# Patient Record
Sex: Female | Born: 1950 | Race: White | Hispanic: No | Marital: Married | Smoking: Former smoker
Health system: Southern US, Community
[De-identification: ages and names within clinical notes are randomized; demographics above are authoritative.]

## PROBLEM LIST (undated history)

## (undated) DIAGNOSIS — K589 Irritable bowel syndrome without diarrhea: Secondary | ICD-10-CM

## (undated) DIAGNOSIS — K635 Polyp of colon: Secondary | ICD-10-CM

## (undated) HISTORY — PX: COLONOSCOPY: SHX174

## (undated) HISTORY — DX: Polyp of colon: K63.5

## (undated) HISTORY — DX: Irritable bowel syndrome, unspecified: K58.9

---

## 2002-05-21 ENCOUNTER — Emergency Department (HOSPITAL_COMMUNITY): Admission: EM | Admit: 2002-05-21 | Discharge: 2002-05-21 | Payer: Self-pay | Admitting: Emergency Medicine

## 2004-01-18 ENCOUNTER — Other Ambulatory Visit: Admission: RE | Admit: 2004-01-18 | Discharge: 2004-01-18 | Payer: Self-pay | Admitting: Family Medicine

## 2004-01-18 ENCOUNTER — Ambulatory Visit: Payer: Self-pay | Admitting: Family Medicine

## 2004-02-24 ENCOUNTER — Encounter: Admission: RE | Admit: 2004-02-24 | Discharge: 2004-02-24 | Payer: Self-pay | Admitting: Family Medicine

## 2004-03-01 ENCOUNTER — Ambulatory Visit: Payer: Self-pay | Admitting: Family Medicine

## 2004-03-06 ENCOUNTER — Encounter: Admission: RE | Admit: 2004-03-06 | Discharge: 2004-03-06 | Payer: Self-pay | Admitting: Family Medicine

## 2005-11-16 ENCOUNTER — Other Ambulatory Visit: Admission: RE | Admit: 2005-11-16 | Discharge: 2005-11-16 | Payer: Self-pay | Admitting: Family Medicine

## 2005-11-16 ENCOUNTER — Encounter: Payer: Self-pay | Admitting: Family Medicine

## 2005-11-16 ENCOUNTER — Ambulatory Visit: Payer: Self-pay | Admitting: Family Medicine

## 2005-11-27 ENCOUNTER — Ambulatory Visit: Payer: Self-pay | Admitting: Internal Medicine

## 2005-12-07 ENCOUNTER — Ambulatory Visit: Payer: Self-pay | Admitting: Internal Medicine

## 2005-12-07 ENCOUNTER — Encounter (INDEPENDENT_AMBULATORY_CARE_PROVIDER_SITE_OTHER): Payer: Self-pay | Admitting: Specialist

## 2005-12-07 LAB — HM COLONOSCOPY

## 2006-04-01 ENCOUNTER — Ambulatory Visit: Payer: Self-pay | Admitting: Family Medicine

## 2007-12-18 ENCOUNTER — Encounter: Payer: Self-pay | Admitting: Family Medicine

## 2008-04-09 ENCOUNTER — Other Ambulatory Visit: Admission: RE | Admit: 2008-04-09 | Discharge: 2008-04-09 | Payer: Self-pay | Admitting: Family Medicine

## 2008-04-09 ENCOUNTER — Ambulatory Visit: Payer: Self-pay | Admitting: Family Medicine

## 2008-04-09 ENCOUNTER — Encounter: Payer: Self-pay | Admitting: Family Medicine

## 2008-04-09 DIAGNOSIS — Z86010 Personal history of colonic polyps: Secondary | ICD-10-CM

## 2008-04-09 DIAGNOSIS — Z780 Asymptomatic menopausal state: Secondary | ICD-10-CM | POA: Insufficient documentation

## 2008-04-09 DIAGNOSIS — Z87891 Personal history of nicotine dependence: Secondary | ICD-10-CM

## 2008-04-12 LAB — CONVERTED CEMR LAB
Basophils Absolute: 0.1 10*3/uL (ref 0.0–0.1)
Bilirubin, Direct: 0.1 mg/dL (ref 0.0–0.3)
Calcium: 9.3 mg/dL (ref 8.4–10.5)
Cholesterol: 187 mg/dL (ref 0–200)
GFR calc Af Amer: 83 mL/min
GFR calc non Af Amer: 69 mL/min
HCT: 36.7 % (ref 36.0–46.0)
Hemoglobin: 12.5 g/dL (ref 12.0–15.0)
LDL Cholesterol: 109 mg/dL — ABNORMAL HIGH (ref 0–99)
Lymphocytes Relative: 21.2 % (ref 12.0–46.0)
MCHC: 34 g/dL (ref 30.0–36.0)
Monocytes Absolute: 0.5 10*3/uL (ref 0.1–1.0)
Neutro Abs: 3.4 10*3/uL (ref 1.4–7.7)
RDW: 12.5 % (ref 11.5–14.6)
Sodium: 144 meq/L (ref 135–145)
Total Bilirubin: 0.9 mg/dL (ref 0.3–1.2)
Triglycerides: 86 mg/dL (ref 0–149)

## 2008-04-14 ENCOUNTER — Encounter (INDEPENDENT_AMBULATORY_CARE_PROVIDER_SITE_OTHER): Payer: Self-pay | Admitting: *Deleted

## 2008-11-05 ENCOUNTER — Encounter: Payer: Self-pay | Admitting: Family Medicine

## 2008-11-11 ENCOUNTER — Encounter (INDEPENDENT_AMBULATORY_CARE_PROVIDER_SITE_OTHER): Payer: Self-pay | Admitting: *Deleted

## 2009-07-02 ENCOUNTER — Emergency Department (HOSPITAL_COMMUNITY): Admission: EM | Admit: 2009-07-02 | Discharge: 2009-07-02 | Payer: Self-pay | Admitting: Emergency Medicine

## 2009-11-11 ENCOUNTER — Encounter: Payer: Self-pay | Admitting: Family Medicine

## 2009-11-11 LAB — HM MAMMOGRAPHY: HM Mammogram: NORMAL

## 2009-11-23 ENCOUNTER — Encounter (INDEPENDENT_AMBULATORY_CARE_PROVIDER_SITE_OTHER): Payer: Self-pay | Admitting: *Deleted

## 2009-11-23 ENCOUNTER — Encounter: Payer: Self-pay | Admitting: Family Medicine

## 2010-02-19 ENCOUNTER — Encounter: Payer: Self-pay | Admitting: Family Medicine

## 2010-02-28 NOTE — Miscellaneous (Signed)
  Clinical Lists Changes  Observations: Added new observation of MAMMO DUE: 11/12/2010 (11/11/2009 8:52) Added new observation of MAMMOGRAM: normal (11/11/2009 8:52)

## 2010-02-28 NOTE — Letter (Signed)
Summary: Results Follow up Letter  [facility]  [address] [place], [place] [address]   Phone: [phone]  Fax: [phone]    11/23/2009 MRN: [id number]    GENIA PERIN [address] [place], [place]  [address]    Dear Ms. Sipe,  The following are the results of your recent test(s):  Test         Result    Pap Smear:        Normal _____  Not Normal _____ Comments: ______________________________________________________ Cholesterol: LDL(Bad cholesterol):         Your goal is less than:         HDL (Good cholesterol):       Your goal is more than: Comments:  ______________________________________________________ Mammogram:        Normal __X___  Not Normal _____ Comments:  Yearly follow up is recommended.   ___________________________________________________________________ Hemoccult:        Normal _____  Not normal _______ Comments:    _____________________________________________________________________ Other Tests:    We routinely do not discuss normal results over the telephone.  If you desire a copy of the results, or you have any questions about this information we can discuss them at your next office visit.   Sincerely,    Marne A. Milinda Antis, M.D.  MAT:lsf

## 2010-03-09 ENCOUNTER — Encounter: Payer: Self-pay | Admitting: Family Medicine

## 2010-03-09 ENCOUNTER — Other Ambulatory Visit: Payer: Self-pay | Admitting: Family Medicine

## 2010-03-09 ENCOUNTER — Encounter (INDEPENDENT_AMBULATORY_CARE_PROVIDER_SITE_OTHER): Payer: BC Managed Care – PPO | Admitting: Family Medicine

## 2010-03-09 DIAGNOSIS — Z0000 Encounter for general adult medical examination without abnormal findings: Secondary | ICD-10-CM

## 2010-03-09 LAB — CBC WITH DIFFERENTIAL/PLATELET
Basophils Absolute: 0 K/uL (ref 0.0–0.1)
Basophils Relative: 0.6 % (ref 0.0–3.0)
Eosinophils Absolute: 0.2 K/uL (ref 0.0–0.7)
Eosinophils Relative: 3.8 % (ref 0.0–5.0)
HCT: 37.1 % (ref 36.0–46.0)
Hemoglobin: 12.6 g/dL (ref 12.0–15.0)
Lymphocytes Relative: 21.7 % (ref 12.0–46.0)
Lymphs Abs: 1.1 K/uL (ref 0.7–4.0)
MCHC: 34 g/dL (ref 30.0–36.0)
MCV: 86.7 fl (ref 78.0–100.0)
Monocytes Absolute: 0.4 K/uL (ref 0.1–1.0)
Monocytes Relative: 8.6 % (ref 3.0–12.0)
Neutro Abs: 3.2 K/uL (ref 1.4–7.7)
Neutrophils Relative %: 65.3 % (ref 43.0–77.0)
Platelets: 178 K/uL (ref 150.0–400.0)
RBC: 4.28 Mil/uL (ref 3.87–5.11)
RDW: 13.6 % (ref 11.5–14.6)
WBC: 4.9 K/uL (ref 4.5–10.5)

## 2010-03-09 LAB — BASIC METABOLIC PANEL
BUN: 15 mg/dL (ref 6–23)
Creatinine, Ser: 0.9 mg/dL (ref 0.4–1.2)
GFR: 68.31 mL/min (ref >60.00)

## 2010-03-09 LAB — LIPID PANEL
Cholesterol: 199 mg/dL (ref 0–200)
LDL Cholesterol: 115 mg/dL — ABNORMAL HIGH (ref 0–99)
Triglycerides: 89 mg/dL (ref 0.0–149.0)
VLDL: 17.8 mg/dL (ref 0.0–40.0)

## 2010-03-09 LAB — HEPATIC FUNCTION PANEL
ALT: 24 U/L (ref 0–35)
Bilirubin, Direct: 0.1 mg/dL (ref 0.0–0.3)
Total Bilirubin: 0.3 mg/dL (ref 0.3–1.2)

## 2010-03-16 NOTE — Assessment & Plan Note (Signed)
Summary: CPX/ CLE   Vital Signs:  Patient profile:   60 year old female Height:      64.25 inches Weight:      190.75 pounds BMI:     32.61 Temp:     98.1 degrees F oral Pulse rate:   64 / minute Pulse rhythm:   regular BP sitting:   110 / 78  (right arm) Cuff size:   regular  Vitals Entered By: Lewanda Rife LPN (March 09, 2010 10:03 AM) CC: CPX LMP 2 yrs ago   History of Present Illness: here for health mt exam and to disc chronic medical problems is feeling good  nothing new medically    wt is up 8 lb so tough to loose weight  does exercise -- walks at lunch hour at work or goes to work -- 35 minutes    110/78-- good bp  colon polyp 11/07- due in fall 2012  nl pap 3/10 - no symptoms/ no problems/ no new partners  no abn paps -- all normal    mam 10/11--normal  self exam   td 07 got flu shot in oct    Allergies (verified): No Known Drug Allergies  Past History:  Past Medical History: Last updated: 04/09/2008 colon polyps former smoker  poss IBS   Past Surgical History: Last updated: 04/09/2008 2001- colonoscopy- 1 polyp 11/07- colonioscopy, polpy, tic, hem  Family History: Last updated: 04/09/2008 Family History of Colon CA 1st degree relative (mother, possibly father) Family History of Stomach CA(father) Family History Ovarian cancer (aunt) Family History Lung cancer (uncle)  Social History: Last updated: 04/09/2008 Occupation: works in Materials engineer Former Smoker (quit 2003) Alcohol use-no Drug use-no Regular exercise-yes married   Risk Factors: Exercise: yes (04/09/2008)  Risk Factors: Smoking Status: quit (04/09/2008)  Review of Systems General:  Denies fatigue, loss of appetite, and malaise. Eyes:  Denies blurring and eye irritation. CV:  Denies chest pain or discomfort, palpitations, shortness of breath with exertion, and swelling of feet. Resp:  Denies cough, shortness of breath, and wheezing. GI:  Denies abdominal  pain, change in bowel habits, indigestion, and nausea. GU:  Denies abnormal vaginal bleeding, discharge, dysuria, and urinary frequency. MS:  Denies joint pain, joint redness, joint swelling, and muscle weakness. Derm:  Denies itching, lesion(s), poor wound healing, and rash. Neuro:  Denies numbness and tingling. Psych:  Denies anxiety and depression. Endo:  Denies cold intolerance, excessive thirst, excessive urination, and heat intolerance. Heme:  Denies abnormal bruising and bleeding.  Physical Exam  General:  overweight but generally well appearing  Head:  normocephalic, atraumatic, and no abnormalities observed.   Eyes:  vision grossly intact, pupils equal, pupils round, and pupils reactive to light.  no conjunctival pallor, injection or icterus  Ears:  R ear normal and L ear normal.   Nose:  no nasal discharge.   Mouth:  pharynx pink and moist.   Neck:  supple with full rom and no masses or thyromegally, no JVD or carotid bruit  Chest Wall:  No deformities, masses, or tenderness noted. Breasts:  No mass, nodules, thickening, tenderness, bulging, retraction, inflamation, nipple discharge or skin changes noted.   Lungs:  Normal respiratory effort, chest expands symmetrically. Lungs are clear to auscultation, no crackles or wheezes. Heart:  Normal rate and regular rhythm. S1 and S2 normal without gallop, murmur, click, rub or other extra sounds. Abdomen:  Bowel sounds positive,abdomen soft and non-tender without masses, organomegaly or hernias noted. no renal bruits  Msk:  No deformity or scoliosis noted of thoracic or lumbar spine.  no acute joint changes  Pulses:  R and L carotid,radial,femoral,dorsalis pedis and posterior tibial pulses are full and equal bilaterally Extremities:  No clubbing, cyanosis, edema, or deformity noted with normal full range of motion of all joints.   Neurologic:  sensation intact to light touch, gait normal, and DTRs symmetrical and normal.   Skin:  Intact  without suspicious lesions or rashes few lentigos  Cervical Nodes:  No lymphadenopathy noted Axillary Nodes:  No palpable lymphadenopathy Inguinal Nodes:  No significant adenopathy Psych:  normal affect, talkative and pleasant    Impression & Recommendations:  Problem # 1:  HEALTH MAINTENANCE EXAM (ICD-V70.0) Assessment Comment Only reviewed health habits including diet, exercise and skin cancer prevention reviewed health maintenance list and family history  wellness labs today Orders: Venipuncture (13086) TLB-Lipid Panel (80061-LIPID) TLB-BMP (Basic Metabolic Panel-BMET) (80048-METABOL) TLB-CBC Platelet - w/Differential (85025-CBCD) TLB-Hepatic/Liver Function Pnl (80076-HEPATIC) TLB-TSH (Thyroid Stimulating Hormone) (84443-TSH)  Complete Medication List: 1)  Multivitamins Tabs (Multiple vitamin) .... Take 1 tablet by mouth once a day  Patient Instructions: 1)  your colonoscopy is due in november 2)  call us in august to do referral for that  3)  keep working on healthy diet and exercise- I find weight watchers program helpful either on line or meetings  4)  labs today   Orders Added: 1)  Venipuncture [36415] 2)  TLB-Lipid Panel [80061-LIPID] 3)  TLB-BMP (Basic Metabolic Panel-BMET) [80048-METABOL] 4)  TLB-CBC Platelet - w/Differential [85025-CBCD] 5)  TLB-Hepatic/Liver Function Pnl [80076-HEPATIC] 6)  TLB-TSH (Thyroid Stimulating Hormone) [84443-TSH] 7)  Est. Patient 40-64 years [57846]   Immunization History:  Influenza Immunization History:    Influenza:  fluvax 3+ (11/02/2009)   Immunization History:  Influenza Immunization History:    Influenza:  Fluvax 3+ (11/02/2009)  Current Allergies (reviewed today): No known allergies

## 2010-12-26 ENCOUNTER — Encounter: Payer: Self-pay | Admitting: Family Medicine

## 2010-12-27 ENCOUNTER — Ambulatory Visit (INDEPENDENT_AMBULATORY_CARE_PROVIDER_SITE_OTHER): Payer: BC Managed Care – PPO | Admitting: Family Medicine

## 2010-12-27 ENCOUNTER — Encounter: Payer: Self-pay | Admitting: Family Medicine

## 2010-12-27 VITALS — BP 124/76 | HR 76 | Temp 97.9°F | Ht 64.25 in | Wt 197.0 lb

## 2010-12-27 DIAGNOSIS — D126 Benign neoplasm of colon, unspecified: Secondary | ICD-10-CM

## 2010-12-27 DIAGNOSIS — Z0000 Encounter for general adult medical examination without abnormal findings: Secondary | ICD-10-CM | POA: Insufficient documentation

## 2010-12-27 DIAGNOSIS — K219 Gastro-esophageal reflux disease without esophagitis: Secondary | ICD-10-CM

## 2010-12-27 LAB — TSH: TSH: 0.76 u[IU]/mL (ref 0.35–5.50)

## 2010-12-27 LAB — CBC WITH DIFFERENTIAL/PLATELET
Basophils Relative: 1.1 % (ref 0.0–3.0)
Eosinophils Relative: 5.3 % — ABNORMAL HIGH (ref 0.0–5.0)
Lymphocytes Relative: 22.4 % (ref 12.0–46.0)
MCV: 86 fl (ref 78.0–100.0)
Monocytes Relative: 8.6 % (ref 3.0–12.0)
Neutrophils Relative %: 62.6 % (ref 43.0–77.0)
RBC: 4.48 Mil/uL (ref 3.87–5.11)
WBC: 5.1 10*3/uL (ref 4.5–10.5)

## 2010-12-27 LAB — COMPREHENSIVE METABOLIC PANEL
Albumin: 4.3 g/dL (ref 3.5–5.2)
BUN: 19 mg/dL (ref 6–23)
CO2: 27 mEq/L (ref 19–32)
Calcium: 9.1 mg/dL (ref 8.4–10.5)
Chloride: 105 mEq/L (ref 96–112)
Glucose, Bld: 93 mg/dL (ref 70–99)
Potassium: 4 mEq/L (ref 3.5–5.1)
Sodium: 141 mEq/L (ref 135–145)
Total Protein: 8 g/dL (ref 6.0–8.3)

## 2010-12-27 LAB — LIPID PANEL: Cholesterol: 186 mg/dL (ref 0–200)

## 2010-12-27 MED ORDER — RANITIDINE HCL 150 MG PO TABS: 150.0000 mg | ORAL_TABLET | Freq: Two times a day (BID) | ORAL | Status: DC

## 2010-12-27 NOTE — Patient Instructions (Addendum)
Try to get 1200-1500 mg of calcium per day with at least 1000 iu of vitamin D - for bone health  In addition to multivitamin  We will do colonoscopy referral at check out  Labs today  Start zantac 150 mg twice daily for acid reflux  Follow up in about 6 months  If zantac does not work -- please call  Keep up the good work with healthy diet and exercise

## 2010-12-27 NOTE — Progress Notes (Signed)
Subjective:    Patient ID: [name] [name], female    DOB: [birth year], 60 y.o.   MRN: [id number]  HPI Has been doing well  Nothing new going on health wise except heartburn  Taken tums and rolaids - and they help  2-3 times per week   Needs health mt exam  bp is 124/76 Wt is stable with bmi 33  Walks for exercise -- walks 5 days per week 30 min - moderate pace  Tries to watch diet -- is good most of the time   Has not tried wt watchers   Labs good  Lab Results  Component Value Date   CHOL 199 03/09/2010   HDL 65.90 03/09/2010   LDLCALC 115* 03/09/2010   TRIG 89.0 03/09/2010   CHOLHDL 3 03/09/2010    Needs labs today - hopes cholesterol will be even better  No fam hx  No heart dz   Got flu shot -mid oct at work  mammo 1 mo ago - thru work - was good  No lumps on self exam   Tdap 10/07  11/07 colonoscopy - and had adenomatous polyps  Is time to schedule  No stool changes   Still a non smoker   3/10 nl pap  No abn pap No new partners No gyn symptoms  No periods  Does not need pap today  Patient Active Problem List  Diagnoses  . COLONIC POLYPS  . TOBACCO USE, QUIT  . POSTMENOPAUSAL STATUS  . Routine general medical examination at a health care facility  . GERD (gastroesophageal reflux disease)   Past Medical History  Diagnosis Date  . Colon polyps   . IBS (irritable bowel syndrome)     possible   No past surgical history on file. History  Substance Use Topics  . Smoking status: Former Smoker    Quit date: 01/29/2001  . Smokeless tobacco: Not on file  . Alcohol Use: No   Family History  Problem Relation Age of Onset  . Cancer Mother     colon cancer  . Cancer Father     stomach CA   No Known Allergies Current Outpatient Prescriptions on File Prior to Visit  Medication Sig Dispense Refill  . Multiple Vitamin (MULTIVITAMIN) tablet Take 1 tablet by mouth daily.              Review of Systems Review of Systems  Constitutional: Negative for  fever, appetite change, fatigue and unexpected weight change.  Eyes: Negative for pain and visual disturbance.  Respiratory: Negative for cough and shortness of breath.   Cardiovascular: Negative for cp or palpitations    Gastrointestinal: Negative for nausea, diarrhea and constipation. pos for heartburn/ indigestion Genitourinary: Negative for urgency and frequency.  Skin: Negative for pallor or rash   Neurological: Negative for weakness, light-headedness, numbness and headaches.  Hematological: Negative for adenopathy. Does not bruise/bleed easily.  Psychiatric/Behavioral: Negative for dysphoric mood. The patient is not nervous/anxious.          Objective:   Physical Exam  Constitutional: She appears well-developed and well-nourished. No distress.       overwt and well appearing   HENT:  Head: Normocephalic and atraumatic.  Right Ear: External ear normal.  Left Ear: External ear normal.  Nose: Nose normal.  Mouth/Throat: Oropharynx is clear and moist.  Eyes: Conjunctivae and EOM are normal. Pupils are equal, round, and reactive to light. No scleral icterus.  Neck: Normal range of motion. Neck supple. Carotid bruit is not present.  No tracheal deviation present. No thyromegaly present.  Cardiovascular: Normal rate, regular rhythm, normal heart sounds and intact distal pulses.  Exam reveals no gallop.   Pulmonary/Chest: Effort normal and breath sounds normal. No respiratory distress. She has no wheezes. She exhibits no tenderness.  Abdominal: Soft. Bowel sounds are normal. She exhibits no distension, no abdominal bruit and no mass. There is no tenderness.  Genitourinary: No breast swelling, tenderness, discharge or bleeding.       No breast masses noted  Musculoskeletal: Normal range of motion. She exhibits no edema and no tenderness.  Lymphadenopathy:    She has no cervical adenopathy.  Neurological: She is alert. She has normal reflexes. No cranial nerve deficit. She exhibits  normal muscle tone. Coordination normal.  Skin: Skin is warm and dry. No rash noted. No erythema. No pallor.  Psychiatric: She has a normal mood and affect.          Assessment & Plan:

## 2010-12-28 NOTE — Assessment & Plan Note (Signed)
New heartburn symptoms- disc risk of progression to esoph injury tx with zantac 150 bid with close f/u Also rev lifestyle change incl diet and wt loss  F/u planned

## 2010-12-28 NOTE — Assessment & Plan Note (Signed)
Reviewed health habits including diet and exercise and skin cancer prevention Also reviewed health mt list, fam hx and immunizations  Wellness labs today Disc need for wt loss for better health

## 2010-12-28 NOTE — Assessment & Plan Note (Signed)
Due for surveillance colonoscopy- that will be scheduled

## 2011-01-31 ENCOUNTER — Encounter: Payer: Self-pay | Admitting: Internal Medicine

## 2011-02-06 ENCOUNTER — Encounter: Payer: Self-pay | Admitting: Internal Medicine

## 2011-04-02 ENCOUNTER — Ambulatory Visit (AMBULATORY_SURGERY_CENTER): Payer: BC Managed Care – PPO | Admitting: *Deleted

## 2011-04-02 VITALS — Ht 65.0 in | Wt 194.0 lb

## 2011-04-02 DIAGNOSIS — Z1211 Encounter for screening for malignant neoplasm of colon: Secondary | ICD-10-CM

## 2011-04-02 DIAGNOSIS — Z86010 Personal history of colonic polyps: Secondary | ICD-10-CM

## 2011-04-02 MED ORDER — PEG-KCL-NACL-NASULF-NA ASC-C 100 G PO SOLR: ORAL | Status: DC

## 2011-04-16 ENCOUNTER — Encounter: Payer: Self-pay | Admitting: Internal Medicine

## 2011-04-16 ENCOUNTER — Ambulatory Visit (AMBULATORY_SURGERY_CENTER): Payer: BC Managed Care – PPO | Admitting: Internal Medicine

## 2011-04-16 VITALS — BP 139/76 | HR 60 | Temp 97.3°F | Resp 18 | Ht 64.0 in | Wt 197.0 lb

## 2011-04-16 DIAGNOSIS — Z86010 Personal history of colonic polyps: Secondary | ICD-10-CM

## 2011-04-16 DIAGNOSIS — Z800 Family history of malignant neoplasm of digestive organs: Secondary | ICD-10-CM

## 2011-04-16 DIAGNOSIS — K635 Polyp of colon: Secondary | ICD-10-CM

## 2011-04-16 DIAGNOSIS — Z1211 Encounter for screening for malignant neoplasm of colon: Secondary | ICD-10-CM

## 2011-04-16 DIAGNOSIS — D126 Benign neoplasm of colon, unspecified: Secondary | ICD-10-CM

## 2011-04-16 MED ORDER — SODIUM CHLORIDE 0.9 % IV SOLN: 500.0000 mL | INTRAVENOUS | Status: DC

## 2011-04-16 NOTE — Progress Notes (Signed)
Patient did not have preoperative order for IV antibiotic SSI prophylaxis. (G8918)  Patient did not experience any of the following events: a burn prior to discharge; a fall within the facility; wrong site/side/patient/procedure/implant event; or a hospital transfer or hospital admission upon discharge from the facility. (G8907)  

## 2011-04-16 NOTE — Patient Instructions (Addendum)

## 2011-04-16 NOTE — Op Note (Signed)
[name] [name] [address] [place], [place]  [address]  COLONOSCOPY PROCEDURE REPORT  PATIENT:  [name], [name]  MR#:  [id number] BIRTHDATE:  1950/04/15, 60 yrs. old  GENDER:  female ENDOSCOPIST:  Iva Boop, MD, [facility]  PROCEDURE DATE:  04/16/2011 PROCEDURE:  Colonoscopy with biopsy and snare polypectomy ASA CLASS:  Class I INDICATIONS:  surveillance and high-risk screening, history of pre-cancerous (adenomatous) colon polyps, family history of colon cancer POLYPS REMOVED 2000 (PATH UNKNOWN) AND 2 ADENOMAS REMOVED 2007 - 8 MM MAX) FATHER (60'S) AND MOTHER (80'S) DIED FROM COLON CANCER MEDICATIONS:   These medications were titrated to patient response per physician's verbal order, Fentanyl 75 mcg IV, Versed 7 mg IV  DESCRIPTION OF PROCEDURE:   After the risks benefits and alternatives of the procedure were thoroughly explained, informed consent was obtained.  Digital rectal exam was performed and revealed no abnormalities.   The [id number] endoscope was introduced through the anus and advanced to the cecum, which was identified by both the appendix and ileocecal valve, without limitations.  The quality of the prep was good, using MoviPrep. The instrument was then slowly withdrawn as the colon was fully examined. <<PROCEDUREIMAGES>>  FINDINGS:  There were multiple polyps identified and removed. Six polyps removed. Four ascending largest 8 mm and another 6 mm and 4 mm cold snared and 2 mm removed with cold biopsy. Transverse 6-7 mm cold snared and a 2-3 mm sigmoid polyp cold biopsy removal. All sent to pathology.  This was otherwise a normal examination of the colon. Includes right colon retroflexion.   Retroflexed views in the rectum revealed no abnormalities.    The time to cecum = 3:29 minutes. The scope was then withdrawn in 20:31 minutes from the cecum and the procedure completed. COMPLICATIONS:  None ENDOSCOPIC IMPRESSION: 1) Six polyps removed, largest  8 mm 2) Otherwise normal examination, good prep 3) Personal history of adenomas and family history of CRCA in both parents  REPEAT EXAM:  In for Colonoscopy, pending biopsy results.  Iva Boop, MD, [facility]  CC:  The Patient  n. eSIGNED:   Iva Boop at 04/16/2011 09:21 AM  Christiana Fuchs, [phone]

## 2011-04-17 ENCOUNTER — Telehealth: Payer: Self-pay

## 2011-04-17 NOTE — Telephone Encounter (Signed)
  Follow up Call-  Call back number 04/16/2011  Post procedure Call Back phone  # [phone] ext [phone]  Permission to leave phone message Yes     Patient questions:  Do you have a fever, pain , or abdominal swelling? no Pain Score  0 *  Have you tolerated food without any problems? yes  Have you been able to return to your normal activities? yes  Do you have any questions about your discharge instructions: Diet   no Medications  no Follow up visit  no  Do you have questions or concerns about your Care? no  Actions: * If pain score is 4 or above: No action needed, pain <4.

## 2011-04-19 ENCOUNTER — Encounter: Payer: Self-pay | Admitting: Internal Medicine

## 2011-04-19 NOTE — Progress Notes (Signed)
Quick Note:  4 TA's and a serrated adenoma. Largest polyp 8 mm Repeat colonoscopy 3 years around 03/2014 ______

## 2011-12-05 ENCOUNTER — Ambulatory Visit (HOSPITAL_BASED_OUTPATIENT_CLINIC_OR_DEPARTMENT_OTHER)
Admission: RE | Admit: 2011-12-05 | Discharge: 2011-12-05 | Disposition: A | Discharge status: Home / Self Care | Payer: BC Managed Care – PPO | Source: Ambulatory Visit | Attending: Family | Admitting: Family

## 2011-12-05 ENCOUNTER — Ambulatory Visit (INDEPENDENT_AMBULATORY_CARE_PROVIDER_SITE_OTHER): Payer: BC Managed Care – PPO | Admitting: Family

## 2011-12-05 ENCOUNTER — Telehealth: Payer: Self-pay | Admitting: Family

## 2011-12-05 ENCOUNTER — Encounter: Payer: Self-pay | Admitting: Family

## 2011-12-05 VITALS — BP 102/76 | HR 76 | Temp 98.6°F | Resp 16 | Wt 173.0 lb

## 2011-12-05 DIAGNOSIS — M79609 Pain in unspecified limb: Secondary | ICD-10-CM

## 2011-12-05 DIAGNOSIS — M79673 Pain in unspecified foot: Secondary | ICD-10-CM

## 2011-12-05 DIAGNOSIS — IMO0002 Reserved for concepts with insufficient information to code with codable children: Secondary | ICD-10-CM

## 2011-12-05 DIAGNOSIS — S93409A Sprain of unspecified ligament of unspecified ankle, initial encounter: Secondary | ICD-10-CM

## 2011-12-05 NOTE — Telephone Encounter (Signed)
Reviewed x-ray.  Neg for fracture.  Called pt. Recommended ACE wrap, ice, ibuprofen prn.  Pt verbalizes understanding.

## 2011-12-05 NOTE — Patient Instructions (Addendum)
Please complete your x ray on the first floor.  We will contact you with your results.   

## 2011-12-05 NOTE — Progress Notes (Signed)
  Subjective:    Patient ID: [name] [name], female    DOB: [birth year], 61 y.o.   MRN: [id number]  HPI  [name] [name] is a 61 yr old female who presents today with chief complaint of left foot pain. She reports pain started following an injury on 11/1. [place] [facility] and twisted her left ankle. Since that time she has found it very difficult to put pressure on her left foot. She reports associated swelling. Has tried aspirin without significant relief.  Review of Systems    see HPI  Past Medical History  Diagnosis Date  . Colon polyps   . IBS (irritable bowel syndrome)     possible    History   Social History  . Marital Status: Married    Spouse Name: N/A    Number of Children: N/A  . Years of Education: N/A   Occupational History  . Not on file.   Social History Main Topics  . Smoking status: Former Smoker    Quit date: 01/29/2001  . Smokeless tobacco: Never Used  . Alcohol Use: No  . Drug Use: No  . Sexually Active:    Other Topics Concern  . Not on file   Social History Narrative  . No narrative on file    No past surgical history on file.  Family History  Problem Relation Age of Onset  . Colon cancer Mother 60  . Colon cancer Father 30    Allergies  Allergen Reactions  . Fish Allergy Hives    Current Outpatient Prescriptions on File Prior to Visit  Medication Sig Dispense Refill  . Multiple Vitamin (MULTIVITAMIN) tablet Take 1 tablet by mouth daily.        . ranitidine (ZANTAC) 150 MG tablet Take 1 tablet (150 mg total) by mouth 2 (two) times daily.  60 tablet  11    BP 102/76  Pulse 76  Temp 98.6 F (37 C) (Oral)  Resp 16  Wt 173 lb (78.472 kg)  SpO2 99%    Objective:   Physical Exam  Constitutional: She appears well-developed and well-nourished. No distress.  Cardiovascular: Normal rate and regular rhythm.   No murmur heard. Pulmonary/Chest: Effort normal and breath sounds normal.    Musculoskeletal:       + swelling left dorsal foot.  + tenderness to palpation of left first metatarsal.           Assessment & Plan:

## 2011-12-07 ENCOUNTER — Telehealth: Payer: Self-pay | Admitting: *Deleted

## 2011-12-07 DIAGNOSIS — IMO0002 Reserved for concepts with insufficient information to code with codable children: Secondary | ICD-10-CM | POA: Insufficient documentation

## 2011-12-07 NOTE — Telephone Encounter (Signed)
Pt returned my and was notified of completion and verified receipt of fax.

## 2011-12-07 NOTE — Assessment & Plan Note (Signed)
X ray is reviewed and is negative for fracture.  Recommended ACE, motrin prn, elevate as much as possible.

## 2011-12-07 NOTE — Telephone Encounter (Signed)
Received fax from pt requesting completion of an [facility] injury form for DOS 12/05/11. Form received on 12/05/11 and faxed back to [phone] at pt's request. Form faxed at 1:27pm today. Left message on home # to return my call.

## 2012-02-26 ENCOUNTER — Encounter: Payer: BC Managed Care – PPO | Admitting: Family Medicine

## 2012-02-27 ENCOUNTER — Encounter: Payer: BC Managed Care – PPO | Admitting: Family Medicine

## 2012-03-11 ENCOUNTER — Other Ambulatory Visit (HOSPITAL_COMMUNITY)
Admission: RE | Admit: 2012-03-11 | Discharge: 2012-03-11 | Disposition: A | Discharge status: Home / Self Care | Payer: BC Managed Care – PPO | Source: Ambulatory Visit | Attending: Family Medicine | Admitting: Family Medicine

## 2012-03-11 ENCOUNTER — Ambulatory Visit (INDEPENDENT_AMBULATORY_CARE_PROVIDER_SITE_OTHER): Payer: 59 | Admitting: Family Medicine

## 2012-03-11 ENCOUNTER — Encounter: Payer: Self-pay | Admitting: Family Medicine

## 2012-03-11 VITALS — BP 118/88 | HR 67 | Temp 98.1°F | Ht 64.5 in | Wt 173.8 lb

## 2012-03-11 DIAGNOSIS — Z23 Encounter for immunization: Secondary | ICD-10-CM

## 2012-03-11 DIAGNOSIS — Z2911 Encounter for prophylactic immunotherapy for respiratory syncytial virus (RSV): Secondary | ICD-10-CM

## 2012-03-11 DIAGNOSIS — Z01419 Encounter for gynecological examination (general) (routine) without abnormal findings: Secondary | ICD-10-CM | POA: Insufficient documentation

## 2012-03-11 DIAGNOSIS — Z0000 Encounter for general adult medical examination without abnormal findings: Secondary | ICD-10-CM

## 2012-03-11 DIAGNOSIS — Z1151 Encounter for screening for human papillomavirus (HPV): Secondary | ICD-10-CM | POA: Insufficient documentation

## 2012-03-11 LAB — CBC WITH DIFFERENTIAL/PLATELET
Basophils Absolute: 0 10*3/uL (ref 0.0–0.1)
Eosinophils Relative: 3.6 % (ref 0.0–5.0)
HCT: 38.2 % (ref 36.0–46.0)
Lymphocytes Relative: 21.8 % (ref 12.0–46.0)
Monocytes Relative: 8.9 % (ref 3.0–12.0)
Neutrophils Relative %: 64.9 % (ref 43.0–77.0)
Platelets: 195 10*3/uL (ref 150.0–400.0)
RDW: 14.3 % (ref 11.5–14.6)
WBC: 4.8 10*3/uL (ref 4.5–10.5)

## 2012-03-11 LAB — COMPREHENSIVE METABOLIC PANEL
ALT: 19 U/L (ref 0–35)
Albumin: 4.3 g/dL (ref 3.5–5.2)
CO2: 28 mEq/L (ref 19–32)
Calcium: 9.2 mg/dL (ref 8.4–10.5)
Chloride: 104 mEq/L (ref 96–112)
GFR: 69.62 mL/min (ref >60.00)
Glucose, Bld: 93 mg/dL (ref 70–99)
Potassium: 3.6 mEq/L (ref 3.5–5.1)
Sodium: 138 mEq/L (ref 135–145)
Total Bilirubin: 0.5 mg/dL (ref 0.3–1.2)
Total Protein: 8.1 g/dL (ref 6.0–8.3)

## 2012-03-11 LAB — TSH: TSH: 0.68 u[IU]/mL (ref 0.35–5.50)

## 2012-03-11 LAB — LDL CHOLESTEROL, DIRECT: Direct LDL: 103.3 mg/dL

## 2012-03-11 LAB — LIPID PANEL: HDL: 74.3 mg/dL (ref >39.00)

## 2012-03-11 NOTE — Progress Notes (Signed)
Subjective:    Patient ID: [name] [name], female    DOB: 07-27-1950, 62 y.o.   MRN: [id number]  HPI Here for health maintenance exam and to review chronic medical problems    Is doing well overall  Nothing new going on   Wt is stable with bmi of 29  Pap 3/10 Will do 3 year pap today  Gyn -no issues or problems   mamogram 10/13- through work  Self exam -no lumps or changes   Is exercising - joined the gym    Flu shot- got that in oct   Zoster status-her ins covers in the office   colonosc 3/13- fam hx and personal hx of polyps  Keeping a close eye on her - 3 year recall     Needs labs today   Patient Active Problem List  Diagnosis  . Personal history of colonic polyps - adenomas   . TOBACCO USE, QUIT  . POSTMENOPAUSAL STATUS  . Routine general medical examination at a health care facility  . GERD (gastroesophageal reflux disease)  . Sprain of ankle or foot, left  . Routine gynecological examination   Past Medical History  Diagnosis Date  . Colon polyps   . IBS (irritable bowel syndrome)     possible   No past surgical history on file. History  Substance Use Topics  . Smoking status: Former Smoker    Quit date: 01/29/2001  . Smokeless tobacco: Never Used  . Alcohol Use: No   Family History  Problem Relation Age of Onset  . Colon cancer Mother 68  . Colon cancer Father 46   Allergies  Allergen Reactions  . Fish Allergy Hives   Current Outpatient Prescriptions on File Prior to Visit  Medication Sig Dispense Refill  . Multiple Vitamin (MULTIVITAMIN) tablet Take 1 tablet by mouth daily.         No current facility-administered medications on file prior to visit.       Review of Systems Review of Systems  Constitutional: Negative for fever, appetite change, fatigue and unexpected weight change.  Eyes: Negative for pain and visual disturbance.  Respiratory: Negative for cough and shortness of breath.   Cardiovascular: Negative for cp or  palpitations    Gastrointestinal: Negative for nausea, diarrhea and constipation.  Genitourinary: Negative for urgency and frequency.  Skin: Negative for pallor or rash   Neurological: Negative for weakness, light-headedness, numbness and headaches.  Hematological: Negative for adenopathy. Does not bruise/bleed easily.  Psychiatric/Behavioral: Negative for dysphoric mood. The patient is not nervous/anxious.         Objective:   Physical Exam  Constitutional: She appears well-developed and well-nourished. No distress.  obese and well appearing   HENT:  Head: Normocephalic and atraumatic.  Right Ear: External ear normal.  Left Ear: External ear normal.  Nose: Nose normal.  Mouth/Throat: Oropharynx is clear and moist. No oropharyngeal exudate.  Eyes: Conjunctivae and EOM are normal. Pupils are equal, round, and reactive to light. Right eye exhibits no discharge. Left eye exhibits no discharge.  Neck: Normal range of motion. Neck supple. No JVD present. No thyromegaly present.  Cardiovascular: Normal rate, regular rhythm, normal heart sounds and intact distal pulses.  Exam reveals no gallop.   Pulmonary/Chest: Effort normal and breath sounds normal. No respiratory distress. She has no wheezes.  Abdominal: Soft. Bowel sounds are normal. She exhibits no distension, no abdominal bruit and no mass. There is no tenderness.  Genitourinary: Vagina normal and uterus normal. No breast swelling,  tenderness or discharge. There is no rash, tenderness or lesion on the right labia. There is no rash, tenderness or lesion on the left labia. Uterus is not enlarged and not tender. Cervix exhibits no motion tenderness, no discharge and no friability. Right adnexum displays no mass, no tenderness and no fullness. Left adnexum displays no mass, no tenderness and no fullness. No bleeding around the vagina. No vaginal discharge found.  Breast exam: No mass, nodules, thickening, tenderness, bulging, retraction,  inflamation, nipple discharge or skin changes noted.  No axillary or clavicular LA.  Chaperoned exam.    Musculoskeletal: She exhibits no edema and no tenderness.  Lymphadenopathy:    She has no cervical adenopathy.  Neurological: She is alert. She has normal reflexes. No cranial nerve deficit. She exhibits normal muscle tone. Coordination normal.  Skin: Skin is warm and dry. No rash noted. No erythema. No pallor.  Psychiatric: She has a normal mood and affect.          Assessment & Plan:

## 2012-03-11 NOTE — Assessment & Plan Note (Signed)
Reviewed health habits including diet and exercise and skin cancer prevention Also reviewed health mt list, fam hx and immunizations  Wellness labs today Zoster vaccine today

## 2012-03-11 NOTE — Assessment & Plan Note (Signed)
Annual exam with pap done  No problems

## 2012-03-11 NOTE — Patient Instructions (Addendum)
Shingles vaccine today Take care of yourself with healthy diet and exercise

## 2012-03-12 ENCOUNTER — Encounter: Payer: Self-pay | Admitting: *Deleted

## 2012-03-18 ENCOUNTER — Encounter: Payer: Self-pay | Admitting: *Deleted

## 2012-10-30 ENCOUNTER — Other Ambulatory Visit: Payer: Self-pay | Admitting: Family Medicine

## 2012-10-30 DIAGNOSIS — Z1231 Encounter for screening mammogram for malignant neoplasm of breast: Secondary | ICD-10-CM

## 2012-10-31 ENCOUNTER — Ambulatory Visit (HOSPITAL_BASED_OUTPATIENT_CLINIC_OR_DEPARTMENT_OTHER)
Admission: RE | Admit: 2012-10-31 | Discharge: 2012-10-31 | Disposition: A | Discharge status: Home / Self Care | Payer: 59 | Source: Ambulatory Visit | Attending: Family Medicine | Admitting: Family Medicine

## 2012-10-31 DIAGNOSIS — Z1231 Encounter for screening mammogram for malignant neoplasm of breast: Secondary | ICD-10-CM | POA: Insufficient documentation

## 2012-12-04 ENCOUNTER — Other Ambulatory Visit: Payer: Self-pay

## 2013-03-16 ENCOUNTER — Ambulatory Visit (INDEPENDENT_AMBULATORY_CARE_PROVIDER_SITE_OTHER): Payer: 59 | Admitting: Family Medicine

## 2013-03-16 ENCOUNTER — Encounter: Payer: Self-pay | Admitting: Family Medicine

## 2013-03-16 VITALS — BP 98/64 | HR 64 | Temp 98.3°F | Ht 64.5 in | Wt 179.2 lb

## 2013-03-16 DIAGNOSIS — Z0000 Encounter for general adult medical examination without abnormal findings: Secondary | ICD-10-CM

## 2013-03-16 LAB — LIPID PANEL
CHOL/HDL RATIO: 3
CHOLESTEROL: 187 mg/dL (ref 0–200)
HDL: 68 mg/dL (ref >39.00)
LDL CALC: 105 mg/dL — AB (ref 0–99)
TRIGLYCERIDES: 71 mg/dL (ref 0.0–149.0)
VLDL: 14.2 mg/dL (ref 0.0–40.0)

## 2013-03-16 LAB — CBC WITH DIFFERENTIAL/PLATELET
BASOS ABS: 0 10*3/uL (ref 0.0–0.1)
Basophils Relative: 1 % (ref 0.0–3.0)
EOS ABS: 0.2 10*3/uL (ref 0.0–0.7)
Eosinophils Relative: 4.3 % (ref 0.0–5.0)
HCT: 39.9 % (ref 36.0–46.0)
HEMOGLOBIN: 13 g/dL (ref 12.0–15.0)
LYMPHS PCT: 24.9 % (ref 12.0–46.0)
Lymphs Abs: 1.1 10*3/uL (ref 0.7–4.0)
MCHC: 32.6 g/dL (ref 30.0–36.0)
MCV: 87.2 fl (ref 78.0–100.0)
MONO ABS: 0.5 10*3/uL (ref 0.1–1.0)
Monocytes Relative: 10 % (ref 3.0–12.0)
NEUTROS ABS: 2.7 10*3/uL (ref 1.4–7.7)
NEUTROS PCT: 59.8 % (ref 43.0–77.0)
Platelets: 176 10*3/uL (ref 150.0–400.0)
RBC: 4.57 Mil/uL (ref 3.87–5.11)
RDW: 14.3 % (ref 11.5–14.6)
WBC: 4.6 10*3/uL (ref 4.5–10.5)

## 2013-03-16 LAB — COMPREHENSIVE METABOLIC PANEL
ALK PHOS: 82 U/L (ref 39–117)
ALT: 22 U/L (ref 0–35)
AST: 22 U/L (ref 0–37)
Albumin: 4.2 g/dL (ref 3.5–5.2)
BUN: 18 mg/dL (ref 6–23)
CALCIUM: 9.2 mg/dL (ref 8.4–10.5)
CHLORIDE: 105 meq/L (ref 96–112)
CO2: 29 mEq/L (ref 19–32)
CREATININE: 0.9 mg/dL (ref 0.4–1.2)
GFR: 65.92 mL/min (ref >60.00)
Glucose, Bld: 90 mg/dL (ref 70–99)
Potassium: 3.9 mEq/L (ref 3.5–5.1)
Sodium: 141 mEq/L (ref 135–145)
Total Bilirubin: 0.4 mg/dL (ref 0.3–1.2)
Total Protein: 7.4 g/dL (ref 6.0–8.3)

## 2013-03-16 LAB — TSH: TSH: 0.94 u[IU]/mL (ref 0.35–5.50)

## 2013-03-16 NOTE — Progress Notes (Signed)
Pre-visit discussion using our clinic review tool. No additional management support is needed unless otherwise documented below in the visit note.  

## 2013-03-16 NOTE — Patient Instructions (Signed)
Take care of yourself  Labs today  Keep exercising

## 2013-03-16 NOTE — Progress Notes (Signed)
Subjective:    Patient ID: [name] [name], female    DOB: [birth year], 63 y.o.   MRN: [id number]  HPI Here for health maintenance exam and to review chronic medical problems    Doing well overall   Wt is up 4 lb with bmi of 30  By her scale has gained 1 lb   Flu vaccine this fall -oct  colonosc 3/13 - 3 years - not due yet (polyps)   Mammogram 10/14 nl  No lumps on self exam  Pap nl 12/14 - no problems gyn at all   Td 10/07  Zoster vaccine 2/ 14   Needs labs today   Mood has been good overall   Patient Active Problem List   Diagnosis Date Noted  . Routine gynecological examination 03/11/2012  . Sprain of ankle or foot, left 12/07/2011  . Routine general medical examination at a health care facility 12/27/2010  . GERD (gastroesophageal reflux disease) 12/27/2010  . Personal history of colonic polyps - adenomas  04/09/2008  . TOBACCO USE, QUIT 04/09/2008  . POSTMENOPAUSAL STATUS 04/09/2008   Past Medical History  Diagnosis Date  . Colon polyps   . IBS (irritable bowel syndrome)     possible   No past surgical history on file. History  Substance Use Topics  . Smoking status: Former Smoker    Quit date: 01/29/2001  . Smokeless tobacco: Never Used  . Alcohol Use: No   Family History  Problem Relation Age of Onset  . Colon cancer Mother 14  . Colon cancer Father 71   Allergies  Allergen Reactions  . Fish Allergy Hives   Current Outpatient Prescriptions on File Prior to Visit  Medication Sig Dispense Refill  . Multiple Vitamin (MULTIVITAMIN) tablet Take 1 tablet by mouth daily.         No current facility-administered medications on file prior to visit.      Review of Systems    Review of Systems  Constitutional: Negative for fever, appetite change, fatigue and unexpected weight change.  Eyes: Negative for pain and visual disturbance.  Respiratory: Negative for cough and shortness of breath.   Cardiovascular: Negative for cp or palpitations      Gastrointestinal: Negative for nausea, diarrhea and constipation.  Genitourinary: Negative for urgency and frequency.  Skin: Negative for pallor or rash   Neurological: Negative for weakness, light-headedness, numbness and headaches.  Hematological: Negative for adenopathy. Does not bruise/bleed easily.  Psychiatric/Behavioral: Negative for dysphoric mood. The patient is not nervous/anxious.      Objective:   Physical Exam  Constitutional: She appears well-developed and well-nourished. No distress.  overwt and well appearing   HENT:  Head: Normocephalic and atraumatic.  Right Ear: External ear normal.  Left Ear: External ear normal.  Mouth/Throat: Oropharynx is clear and moist.  Eyes: Conjunctivae and EOM are normal. Pupils are equal, round, and reactive to light. No scleral icterus.  Neck: Normal range of motion. Neck supple. No JVD present. Carotid bruit is not present. No thyromegaly present.  Cardiovascular: Normal rate, regular rhythm, normal heart sounds and intact distal pulses.  Exam reveals no gallop.   Pulmonary/Chest: Effort normal and breath sounds normal. No respiratory distress. She has no wheezes. She exhibits no tenderness.  Abdominal: Soft. Bowel sounds are normal. She exhibits no distension, no abdominal bruit and no mass. There is no tenderness.  Genitourinary: No breast swelling, tenderness, discharge or bleeding.  Breast exam: No mass, nodules, thickening, tenderness, bulging, retraction, inflamation, nipple discharge or  skin changes noted.  No axillary or clavicular LA.    Musculoskeletal: Normal range of motion. She exhibits no edema and no tenderness.  Lymphadenopathy:    She has no cervical adenopathy.  Neurological: She is alert. She has normal reflexes. No cranial nerve deficit. She exhibits normal muscle tone. Coordination normal.  Skin: Skin is warm and dry. No rash noted. No erythema. No pallor.  Psychiatric: She has a normal mood and affect.           Assessment & Plan:

## 2013-03-16 NOTE — Assessment & Plan Note (Signed)
Reviewed health habits including diet and exercise and skin cancer prevention Reviewed appropriate screening tests for age  Also reviewed health mt list, fam hx and immunization status , as well as social and family history   Labs today  

## 2013-09-30 ENCOUNTER — Encounter: Payer: Self-pay | Admitting: Family Medicine

## 2013-09-30 ENCOUNTER — Ambulatory Visit (INDEPENDENT_AMBULATORY_CARE_PROVIDER_SITE_OTHER): Payer: 59 | Admitting: Family Medicine

## 2013-09-30 VITALS — BP 112/82 | HR 65 | Temp 97.7°F | Ht 64.5 in | Wt 185.8 lb

## 2013-09-30 DIAGNOSIS — R21 Rash and other nonspecific skin eruption: Secondary | ICD-10-CM

## 2013-09-30 DIAGNOSIS — T7840XA Allergy, unspecified, initial encounter: Secondary | ICD-10-CM | POA: Insufficient documentation

## 2013-09-30 NOTE — Progress Notes (Signed)
Subjective:    Patient ID: [name] [name], female    DOB: [birth year], 63 y.o.   MRN: [id number]  HPI Here with a rash all over   She started using a new tide detergent  On Sunday-started breaking out on chest -then spread to whole trunk and extremities  Itchy and prickly -not painful  Not too bad   None on face or scalp None on palms or soles   No tick or insect bite  No exp to chiggers   She does not use fabric softener   Has not treated with anything   She has not had fish or shellfish   No new foods   Patient Active Problem List   Diagnosis Date Noted  . Routine gynecological examination 03/11/2012  . Routine general medical examination at a health care facility 12/27/2010  . GERD (gastroesophageal reflux disease) 12/27/2010  . Personal history of colonic polyps - adenomas  04/09/2008  . TOBACCO USE, QUIT 04/09/2008  . POSTMENOPAUSAL STATUS 04/09/2008   Past Medical History  Diagnosis Date  . Colon polyps   . IBS (irritable bowel syndrome)     possible   No past surgical history on file. History  Substance Use Topics  . Smoking status: Former Smoker    Quit date: 01/29/2001  . Smokeless tobacco: Never Used  . Alcohol Use: No   Family History  Problem Relation Age of Onset  . Colon cancer Mother 32  . Colon cancer Father 66   Allergies  Allergen Reactions  . Fish Allergy Hives   Current Outpatient Prescriptions on File Prior to Visit  Medication Sig Dispense Refill  . Multiple Vitamin (MULTIVITAMIN) tablet Take 1 tablet by mouth daily.         No current facility-administered medications on file prior to visit.      Review of Systems    Review of Systems  Constitutional: Negative for fever, appetite change, fatigue and unexpected weight change.  Eyes: Negative for pain and visual disturbance.  ENT neg for mouth or throat swelling  Respiratory: Negative for cough and shortness of breath.   Cardiovascular: Negative for cp or palpitations     Gastrointestinal: Negative for nausea, diarrhea and constipation.  Genitourinary: Negative for urgency and frequency.  Skin: Negative for pallor and pos for rash   Neurological: Negative for weakness, light-headedness, numbness and headaches.  Hematological: Negative for adenopathy. Does not bruise/bleed easily.  Psychiatric/Behavioral: Negative for dysphoric mood. The patient is not nervous/anxious.      Objective:   Physical Exam  Constitutional: She appears well-developed and well-nourished. No distress.  obese and well appearing   HENT:  Head: Normocephalic and atraumatic.  Mouth/Throat: Oropharynx is clear and moist.  Eyes: Conjunctivae and EOM are normal. Pupils are equal, round, and reactive to light. Right eye exhibits no discharge. Left eye exhibits no discharge.  Neck: Normal range of motion. Neck supple.  Cardiovascular: Normal rate and regular rhythm.   Pulmonary/Chest: Effort normal and breath sounds normal. No respiratory distress. She has no wheezes. She has no rales.  Lymphadenopathy:    She has no cervical adenopathy.  Neurological: She is alert.  Skin: Skin is warm and dry. Rash noted.  Diffuse papular rash on extremities and trunk  Pale pink in color  No confluence or hives  No excoriation or signs of infection   Psychiatric: She has a normal mood and affect.          Assessment & Plan:   Problem List  Items Addressed This Visit     Musculoskeletal and Integument   Rash due to allergy - Primary     Suspect allergy to her new laundry detergent  Will change to color and fragrance free brand and also for soap and moisturizer (see AVS for inst) Will try zyrtec 10 mg qhs until better Adv to stay cool Update if not starting to improve in a week or if worsening  -or if new symptoms

## 2013-09-30 NOTE — Assessment & Plan Note (Signed)
Suspect allergy to her new laundry detergent  Will change to color and fragrance free brand and also for soap and moisturizer (see AVS for inst) Will try zyrtec 10 mg qhs until better Adv to stay cool Update if not starting to improve in a week or if worsening  -or if new symptoms

## 2013-09-30 NOTE — Progress Notes (Signed)
Pre visit review using our clinic review tool, if applicable. No additional management support is needed unless otherwise documented below in the visit note. 

## 2013-09-30 NOTE — Patient Instructions (Signed)
Get zyrtec 10 mg over the counter and take 1 each evening (until better)  This will help the rash and itch  Change detergent to Tide Free (or any other Free detergent) Avoid fabric softener For now - change soap to [place] for sensitive skin  For moisturizer use lubriderm for sensitive skin   Update if not starting to improve in a week or if worsening

## 2013-11-09 ENCOUNTER — Other Ambulatory Visit: Payer: Self-pay | Admitting: Family Medicine

## 2013-11-09 DIAGNOSIS — Z1231 Encounter for screening mammogram for malignant neoplasm of breast: Secondary | ICD-10-CM

## 2013-11-10 ENCOUNTER — Ambulatory Visit (HOSPITAL_BASED_OUTPATIENT_CLINIC_OR_DEPARTMENT_OTHER)
Admission: RE | Admit: 2013-11-10 | Discharge: 2013-11-10 | Disposition: A | Discharge status: Home / Self Care | Payer: 59 | Source: Ambulatory Visit | Attending: Family Medicine | Admitting: Family Medicine

## 2013-11-10 DIAGNOSIS — Z1231 Encounter for screening mammogram for malignant neoplasm of breast: Secondary | ICD-10-CM | POA: Insufficient documentation

## 2013-11-13 ENCOUNTER — Other Ambulatory Visit: Payer: Self-pay

## 2013-11-13 ENCOUNTER — Ambulatory Visit (HOSPITAL_BASED_OUTPATIENT_CLINIC_OR_DEPARTMENT_OTHER): Payer: 59

## 2013-12-30 ENCOUNTER — Telehealth: Payer: Self-pay | Admitting: Family Medicine

## 2014-04-19 ENCOUNTER — Encounter: Payer: Self-pay | Admitting: Internal Medicine

## 2014-04-26 ENCOUNTER — Encounter: Payer: Self-pay | Admitting: Internal Medicine

## 2014-06-16 ENCOUNTER — Ambulatory Visit (AMBULATORY_SURGERY_CENTER): Payer: Self-pay | Admitting: *Deleted

## 2014-06-16 ENCOUNTER — Encounter: Payer: Self-pay | Admitting: Internal Medicine

## 2014-06-16 VITALS — Ht 65.0 in | Wt 192.0 lb

## 2014-06-16 DIAGNOSIS — Z860100 Personal history of colon polyps, unspecified: Secondary | ICD-10-CM

## 2014-06-16 DIAGNOSIS — Z800 Family history of malignant neoplasm of digestive organs: Secondary | ICD-10-CM

## 2014-06-16 NOTE — Progress Notes (Signed)
No egg or soy allergy No issues with past sedation No diet pills emmi video declined

## 2014-06-30 ENCOUNTER — Encounter: Payer: Self-pay | Admitting: Internal Medicine

## 2014-06-30 ENCOUNTER — Ambulatory Visit (AMBULATORY_SURGERY_CENTER): Payer: 59 | Admitting: Internal Medicine

## 2014-06-30 VITALS — BP 117/80 | HR 66 | Temp 97.4°F | Resp 17 | Wt 192.0 lb

## 2014-06-30 DIAGNOSIS — Z86010 Personal history of colonic polyps: Secondary | ICD-10-CM | POA: Diagnosis present

## 2014-06-30 DIAGNOSIS — D120 Benign neoplasm of cecum: Secondary | ICD-10-CM | POA: Diagnosis not present

## 2014-06-30 MED ORDER — SODIUM CHLORIDE 0.9 % IV SOLN: 500.0000 mL | INTRAVENOUS | Status: DC

## 2014-06-30 NOTE — Patient Instructions (Signed)
Colon polyp x 1 removed today. See handout on polyps. Repeat colonoscopy in 5 years.  Call us with any questions or concerns. Thank you.   YOU HAD AN ENDOSCOPIC PROCEDURE TODAY AT [facility]:   Refer to the procedure report that was given to you for any specific questions about what was found during the examination.  If the procedure report does not answer your questions, please call your gastroenterologist to clarify.  If you requested that your care partner not be given the details of your procedure findings, then the procedure report has been included in a sealed envelope for you to review at your convenience later.  YOU SHOULD EXPECT: Some feelings of bloating in the abdomen. Passage of more gas than usual.  Walking can help get rid of the air that was put into your GI tract during the procedure and reduce the bloating. If you had a lower endoscopy (such as a colonoscopy or flexible sigmoidoscopy) you may notice spotting of blood in your stool or on the toilet paper. If you underwent a bowel prep for your procedure, you may not have a normal bowel movement for a few days.  Please Note:  You might notice some irritation and congestion in your nose or some drainage.  This is from the oxygen used during your procedure.  There is no need for concern and it should clear up in a day or so.  SYMPTOMS TO REPORT IMMEDIATELY:   Following lower endoscopy (colonoscopy or flexible sigmoidoscopy):  Excessive amounts of blood in the stool  Significant tenderness or worsening of abdominal pains  Swelling of the abdomen that is new, acute  Fever of 100F or higher   For urgent or emergent issues, a gastroenterologist can be reached at any hour by calling [phone].   DIET: Your first meal following the procedure should be a small meal and then it is ok to progress to your normal diet. Heavy or fried foods are harder to digest and may make you feel nauseous or bloated.  Likewise,  meals heavy in dairy and vegetables can increase bloating.  Drink plenty of fluids but you should avoid alcoholic beverages for 24 hours.  ACTIVITY:  You should plan to take it easy for the rest of today and you should NOT DRIVE or use heavy machinery until tomorrow (because of the sedation medicines used during the test).    FOLLOW UP: Our staff will call the number listed on your records the next business day following your procedure to check on you and address any questions or concerns that you may have regarding the information given to you following your procedure. If we do not reach you, we will leave a message.  However, if you are feeling well and you are not experiencing any problems, there is no need to return our call.  We will assume that you have returned to your regular daily activities without incident.  If any biopsies were taken you will be contacted by phone or by letter within the next 1-3 weeks.  Please call us at [phone] if you have not heard about the biopsies in 3 weeks.    SIGNATURES/CONFIDENTIALITY: You and/or your care partner have signed paperwork which will be entered into your electronic medical record.  These signatures attest to the fact that that the information above on your After Visit Summary has been reviewed and is understood.  Full responsibility of the confidentiality of this discharge information lies with you and/or  your care-partner. 

## 2014-06-30 NOTE — Progress Notes (Signed)
Called to room to assist during endoscopic procedure.  Patient ID and intended procedure confirmed with present staff. Received instructions for my participation in the procedure from the performing physician.  

## 2014-06-30 NOTE — Progress Notes (Signed)
Patient awakening,vss,report to rn 

## 2014-06-30 NOTE — Op Note (Signed)
[place]  [address] [place], [id number]   COLONOSCOPY PROCEDURE REPORT  PATIENT: [name] [name], [name] [name]  MR#: [id number] BIRTHDATE: [birth year] , 69  yrs. old GENDER: female ENDOSCOPIST: Gatha Mayer, MD, [facility] PROCEDURE DATE:  06/30/2014 PROCEDURE:   Colonoscopy with snare polypectomy First Screening Colonoscopy - Avg.  risk and is 50 yrs.  old or older - No.  Prior Negative Screening - Now for repeat screening. N/A  History of Adenoma - Now for follow-up colonoscopy & has been > or = to 3 yrs.  Yes hx of adenoma.  Has been 3 or more years since last colonoscopy.  Polyps removed today? Yes ASA CLASS:   Class II INDICATIONS:Surveillance due to prior colonic neoplasia, FH Colon or Rectal Adenocarcinoma, and PH Colon Adenoma. MEDICATIONS: Propofol 200 mg IV and Monitored anesthesia care  DESCRIPTION OF PROCEDURE:   After the risks benefits and alternatives of the procedure were thoroughly explained, informed consent was obtained.  The digital rectal exam revealed no abnormalities of the rectum.   The LB [id number]  endoscope was introduced through the anus and advanced to the cecum, which was identified by both the appendix and ileocecal valve. No adverse events experienced.   The quality of the prep was excellent. (MiraLax was used)  The instrument was then slowly withdrawn as the colon was fully examined. Estimated blood loss is zero unless otherwise noted in this procedure report.      COLON FINDINGS: A sessile polyp measuring 4 mm in size was found at the cecum.  A polypectomy was performed with a cold snare.  The resection was complete, the polyp tissue was completely retrieved and sent to histology.   The examination was otherwise normal. Retroflexed views revealed no abnormalities. The time to cecum = 4.5 Withdrawal time = 13.3   The scope was withdrawn and the procedure completed. COMPLICATIONS: There were no immediate  complications.  ENDOSCOPIC IMPRESSION: 1.   Sessile polyp was found at the cecum; polypectomy was performed with a cold snare 2.   The examination was otherwise normal - excellent prep  RECOMMENDATIONS: Timing of repeat colonoscopy will be determined by pathology findings in patient w/ prior adenomas/ssp and FHx CRCA both parents. Patient prefers 5 yrs next - that is probably reasonable.  eSigned:  Gatha Mayer, MD, [facility] 06/30/2014 8:59 AM   cc: The Patient

## 2014-07-01 ENCOUNTER — Telehealth: Payer: Self-pay | Admitting: *Deleted

## 2014-07-01 NOTE — Telephone Encounter (Signed)
  Follow up Call-  Call back number 06/30/2014  Post procedure Call Back phone  # [phone]  Permission to leave phone message Yes     Patient questions:  Do you have a fever, pain , or abdominal swelling? No. Pain Score  0 *  Have you tolerated food without any problems? Yes.    Have you been able to return to your normal activities? Yes.    Do you have any questions about your discharge instructions: Diet   No. Medications  No. Follow up visit  No.  Do you have questions or concerns about your Care? No.  Actions: * If pain score is 4 or above: No action needed, pain <4.

## 2014-07-06 ENCOUNTER — Encounter: Payer: Self-pay | Admitting: Internal Medicine

## 2014-07-06 NOTE — Progress Notes (Signed)
Quick Note:  4 mm ssp - repeat colonoscopy 2021 ______

## 2014-08-03 ENCOUNTER — Encounter: Payer: 59 | Admitting: Family Medicine

## 2014-10-27 ENCOUNTER — Other Ambulatory Visit: Payer: Self-pay | Admitting: Family Medicine

## 2014-10-27 DIAGNOSIS — Z1231 Encounter for screening mammogram for malignant neoplasm of breast: Secondary | ICD-10-CM

## 2014-11-02 ENCOUNTER — Ambulatory Visit (HOSPITAL_BASED_OUTPATIENT_CLINIC_OR_DEPARTMENT_OTHER)
Admission: RE | Admit: 2014-11-02 | Discharge: 2014-11-02 | Disposition: A | Discharge status: Home / Self Care | Payer: 59 | Source: Ambulatory Visit | Attending: Family Medicine | Admitting: Family Medicine

## 2014-11-02 DIAGNOSIS — Z1231 Encounter for screening mammogram for malignant neoplasm of breast: Secondary | ICD-10-CM | POA: Diagnosis present

## 2015-01-26 ENCOUNTER — Ambulatory Visit (INDEPENDENT_AMBULATORY_CARE_PROVIDER_SITE_OTHER): Payer: 59 | Admitting: Family Medicine

## 2015-01-26 ENCOUNTER — Encounter: Payer: Self-pay | Admitting: Family Medicine

## 2015-01-26 VITALS — BP 120/84 | HR 64 | Temp 98.3°F | Ht 64.5 in | Wt 192.0 lb

## 2015-01-26 DIAGNOSIS — E669 Obesity, unspecified: Secondary | ICD-10-CM

## 2015-01-26 DIAGNOSIS — Z0000 Encounter for general adult medical examination without abnormal findings: Secondary | ICD-10-CM | POA: Diagnosis not present

## 2015-01-26 NOTE — Progress Notes (Signed)
Subjective:    Patient ID: [name] [name], female    DOB: [birth year], 64 y.o.   MRN: [id number]  HPI Here for health maintenance exam and to review chronic medical problems    Feeling great in general   Eating well except for the holidays  Walks on breaks at work - 3 mi per day (5 days per week)  Usually watches her carbs  Keeps trying to loose wt- her metabolism slowed down with age   [place] is stable  Obese with bmi of 32  Hep C/HIV screen- not high risk/ declines   Flu shot 10/16  Pap 2/14 nl  No gyn problems or symptoms -no vaginal bleeding  Post menopausal  No abn paps  No new partners   Mammogram 10/16 nl  Self breast exam -no lumps or changes   Td 10/07  Colonoscopy 6/16- polyp Strong fam hx of colon cancer Recall is 2021   Due for labs Wants to come back for fasting labs tomorrow   Patient Active Problem List   Diagnosis Date Noted  . Rash due to allergy 09/30/2013  . Routine gynecological examination 03/11/2012  . Routine general medical examination at a health care facility 12/27/2010  . GERD (gastroesophageal reflux disease) 12/27/2010  . History of colonic polyps - adenomas 04/09/2008  . TOBACCO USE, QUIT 04/09/2008  . POSTMENOPAUSAL STATUS 04/09/2008   Past Medical History  Diagnosis Date  . Colon polyps   . IBS (irritable bowel syndrome)     possible   Past Surgical History  Procedure Laterality Date  . Colonoscopy     Social History  Substance Use Topics  . Smoking status: Former Smoker    Quit date: 01/29/2001  . Smokeless tobacco: Never Used  . Alcohol Use: No   Family History  Problem Relation Age of Onset  . Colon cancer Mother 76  . Colon cancer Father 23  . Colon polyps Brother   . Esophageal cancer Neg Hx   . Rectal cancer Neg Hx   . Stomach cancer Neg Hx   . Colon polyps Brother    Allergies  Allergen Reactions  . Fish Allergy Hives   Current Outpatient Prescriptions on File Prior to Visit  Medication Sig  Dispense Refill  . Multiple Vitamin (MULTIVITAMIN) tablet Take 1 tablet by mouth daily.       No current facility-administered medications on file prior to visit.      Review of Systems Review of Systems  Constitutional: Negative for fever, appetite change, fatigue and unexpected weight change.  Eyes: Negative for pain and visual disturbance.  Respiratory: Negative for cough and shortness of breath.   Cardiovascular: Negative for cp or palpitations    Gastrointestinal: Negative for nausea, diarrhea and constipation.  Genitourinary: Negative for urgency and frequency.  Skin: Negative for pallor or rash   Neurological: Negative for weakness, light-headedness, numbness and headaches.  Hematological: Negative for adenopathy. Does not bruise/bleed easily.  Psychiatric/Behavioral: Negative for dysphoric mood. The patient is not nervous/anxious.         Objective:   Physical Exam  Constitutional: She appears well-developed and well-nourished. No distress.  obese and well appearing   HENT:  Head: Normocephalic and atraumatic.  Right Ear: External ear normal.  Left Ear: External ear normal.  Mouth/Throat: Oropharynx is clear and moist.  Eyes: Conjunctivae and EOM are normal. Pupils are equal, round, and reactive to light. No scleral icterus.  Neck: Normal range of motion. Neck supple. No JVD present. Carotid bruit  is not present. No thyromegaly present.  Cardiovascular: Normal rate, regular rhythm, normal heart sounds and intact distal pulses.  Exam reveals no gallop.   Pulmonary/Chest: Effort normal and breath sounds normal. No respiratory distress. She has no wheezes. She exhibits no tenderness.  Abdominal: Soft. Bowel sounds are normal. She exhibits no distension, no abdominal bruit and no mass. There is no tenderness.  Genitourinary: No breast swelling, tenderness, discharge or bleeding.  Breast exam: No mass, nodules, thickening, tenderness, bulging, retraction, inflamation, nipple  discharge or skin changes noted.  No axillary or clavicular LA.      Musculoskeletal: Normal range of motion. She exhibits no edema or tenderness.  Lymphadenopathy:    She has no cervical adenopathy.  Neurological: She is alert. She has normal reflexes. No cranial nerve deficit. She exhibits normal muscle tone. Coordination normal.  Skin: Skin is warm and dry. No rash noted. No erythema. No pallor.  Psychiatric: She has a normal mood and affect.  Vitals reviewed.         Assessment & Plan:   Problem List Items Addressed This Visit      Other   Routine general medical examination at a health care facility - Primary    Reviewed health habits including diet and exercise and skin cancer prevention Reviewed appropriate screening tests for age  Also reviewed health mt list, fam hx and immunization status , as well as social and family history   See HPI Labs ordered for fasting draw tomorrow Come in for fasting labs tomorrow am  Water and black coffee only in am - if necessary, eat something light  For cholesterol    Avoid red meat/ fried foods/ egg yolks/ fatty breakfast meats/ butter, cheese and high fat dairy/ and shellfish   Keep exercising  For weight loss- try 1200 calorie a day diet - focus on healthier foods  Take care of yourself       Relevant Orders   CBC with Differential/Platelet   Comprehensive metabolic panel   TSH   Lipid panel

## 2015-01-26 NOTE — Patient Instructions (Signed)
Come in for fasting labs tomorrow am  Water and black coffee only in am - if necessary, eat something light  For cholesterol    Avoid red meat/ fried foods/ egg yolks/ fatty breakfast meats/ butter, cheese and high fat dairy/ and shellfish   Keep exercising  For weight loss- try 1200 calorie a day diet - focus on healthier foods  Take care of yourself

## 2015-01-26 NOTE — Progress Notes (Signed)
Pre visit review using our clinic review tool, if applicable. No additional management support is needed unless otherwise documented below in the visit note. 

## 2015-01-27 ENCOUNTER — Other Ambulatory Visit (INDEPENDENT_AMBULATORY_CARE_PROVIDER_SITE_OTHER): Payer: 59

## 2015-01-27 DIAGNOSIS — Z0000 Encounter for general adult medical examination without abnormal findings: Secondary | ICD-10-CM | POA: Diagnosis not present

## 2015-01-27 DIAGNOSIS — E663 Overweight: Secondary | ICD-10-CM | POA: Insufficient documentation

## 2015-01-27 LAB — CBC WITH DIFFERENTIAL/PLATELET
Basophils Absolute: 0 10*3/uL (ref 0.0–0.1)
Basophils Relative: 1 % (ref 0.0–3.0)
EOS ABS: 0.2 10*3/uL (ref 0.0–0.7)
Eosinophils Relative: 4.4 % (ref 0.0–5.0)
HCT: 38.4 % (ref 36.0–46.0)
HEMOGLOBIN: 12.7 g/dL (ref 12.0–15.0)
Lymphocytes Relative: 25.9 % (ref 12.0–46.0)
Lymphs Abs: 1.2 10*3/uL (ref 0.7–4.0)
MCHC: 33 g/dL (ref 30.0–36.0)
MCV: 86.2 fl (ref 78.0–100.0)
MONO ABS: 0.3 10*3/uL (ref 0.1–1.0)
Monocytes Relative: 7.6 % (ref 3.0–12.0)
Neutro Abs: 2.7 10*3/uL (ref 1.4–7.7)
Neutrophils Relative %: 61.1 % (ref 43.0–77.0)
Platelets: 196 10*3/uL (ref 150.0–400.0)
RBC: 4.45 Mil/uL (ref 3.87–5.11)
RDW: 14.3 % (ref 11.5–15.5)
WBC: 4.5 10*3/uL (ref 4.0–10.5)

## 2015-01-27 LAB — COMPREHENSIVE METABOLIC PANEL
ALBUMIN: 4.1 g/dL (ref 3.5–5.2)
ALK PHOS: 91 U/L (ref 39–117)
ALT: 23 U/L (ref 0–35)
AST: 21 U/L (ref 0–37)
BUN: 12 mg/dL (ref 6–23)
CO2: 30 mEq/L (ref 19–32)
CREATININE: 0.98 mg/dL (ref 0.40–1.20)
Calcium: 10 mg/dL (ref 8.4–10.5)
Chloride: 104 mEq/L (ref 96–112)
GFR: 60.91 mL/min (ref >60.00)
Glucose, Bld: 90 mg/dL (ref 70–99)
Potassium: 4.1 mEq/L (ref 3.5–5.1)
SODIUM: 142 meq/L (ref 135–145)
TOTAL PROTEIN: 7.2 g/dL (ref 6.0–8.3)
Total Bilirubin: 0.5 mg/dL (ref 0.2–1.2)

## 2015-01-27 LAB — LIPID PANEL
Cholesterol: 199 mg/dL (ref 0–200)
HDL: 69.6 mg/dL (ref >39.00)
LDL Cholesterol: 106 mg/dL — ABNORMAL HIGH (ref 0–99)
NONHDL: 129.3
Total CHOL/HDL Ratio: 3
Triglycerides: 118 mg/dL (ref 0.0–149.0)
VLDL: 23.6 mg/dL (ref 0.0–40.0)

## 2015-01-27 LAB — TSH: TSH: 1.43 u[IU]/mL (ref 0.35–4.50)

## 2015-01-27 NOTE — Assessment & Plan Note (Signed)
Discussed how this problem influences overall health and the risks it imposes  Reviewed plan for weight loss with lower calorie diet (via better food choices and also portion control or program like weight watchers) and exercise building up to or more than 30 minutes 5 days per week including some aerobic activity    Disc trial of 1200 cal per day diet with daily exercise

## 2015-01-27 NOTE — Assessment & Plan Note (Signed)
Reviewed health habits including diet and exercise and skin cancer prevention Reviewed appropriate screening tests for age  Also reviewed health mt list, fam hx and immunization status , as well as social and family history   See HPI Labs ordered for fasting draw tomorrow Come in for fasting labs tomorrow am  Water and black coffee only in am - if necessary, eat something light  For cholesterol    Avoid red meat/ fried foods/ egg yolks/ fatty breakfast meats/ butter, cheese and high fat dairy/ and shellfish   Keep exercising  For weight loss- try 1200 calorie a day diet - focus on healthier foods  Take care of yourself

## 2015-07-08 ENCOUNTER — Ambulatory Visit (INDEPENDENT_AMBULATORY_CARE_PROVIDER_SITE_OTHER)
Admission: RE | Admit: 2015-07-08 | Discharge: 2015-07-08 | Disposition: A | Discharge status: Home / Self Care | Payer: 59 | Source: Ambulatory Visit | Attending: Primary Care | Admitting: Primary Care

## 2015-07-08 ENCOUNTER — Ambulatory Visit (INDEPENDENT_AMBULATORY_CARE_PROVIDER_SITE_OTHER): Payer: 59 | Admitting: Primary Care

## 2015-07-08 ENCOUNTER — Encounter: Payer: Self-pay | Admitting: Primary Care

## 2015-07-08 VITALS — BP 120/84 | HR 62 | Temp 98.1°F | Ht 65.0 in | Wt 199.4 lb

## 2015-07-08 DIAGNOSIS — M25472 Effusion, left ankle: Secondary | ICD-10-CM

## 2015-07-08 NOTE — Patient Instructions (Signed)
Complete xray(s) prior to leaving today. I will notify you of your results once received.  I recommend ibuprofen 600 mg three times daily as needed for pain and inflammation.   Also, wrap your ankle with a brace or ACE bandage for support.  Elevate your ankle when resting at night and apply ice for swelling.  I will be in touch with you soon.  It was a pleasure meeting you!

## 2015-07-08 NOTE — Progress Notes (Signed)
   Subjective:    Patient ID: [name] [name], female    DOB: 07-21-1950, 65 y.o.   MRN: [id number]  HPI  Ms. Arakelian is a 65 year old female who presents today with a chief complaint of ankle swelling and pain. Her symptoms are located to to her left ankle.   She fell after missing a step in January 2017 and twisted her left ankle. She noticed increased swelling and bruising immediatly that dissipated within a few weeks. Her symptoms reoccurred this Monday with swelling and very mild bruising.   She's recently started walking more at work during her breaks. Her symptoms are worse with walking and eversion of her ankle. She's not taken anything OTC for her symptoms. Denies recent re-injury or trauma.  Review of Systems  Musculoskeletal: Positive for arthralgias.  Skin: Negative for color change.  Neurological: Negative for numbness.       Past Medical History  Diagnosis Date  . Colon polyps   . IBS (irritable bowel syndrome)     possible     Social History   Social History  . Marital Status: Married    Spouse Name: N/A  . Number of Children: N/A  . Years of Education: N/A   Occupational History  . Not on file.   Social History Main Topics  . Smoking status: Former Smoker    Quit date: 01/29/2001  . Smokeless tobacco: Never Used  . Alcohol Use: No  . Drug Use: No  . Sexual Activity: Not on file   Other Topics Concern  . Not on file   Social History Narrative    Past Surgical History  Procedure Laterality Date  . Colonoscopy      Family History  Problem Relation Age of Onset  . Colon cancer Mother 79  . Colon cancer Father 79  . Colon polyps Brother   . Esophageal cancer Neg Hx   . Rectal cancer Neg Hx   . Stomach cancer Neg Hx   . Colon polyps Brother     Allergies  Allergen Reactions  . Fish Allergy Hives    Current Outpatient Prescriptions on File Prior to Visit  Medication Sig Dispense Refill  . Multiple Vitamin (MULTIVITAMIN) tablet Take 1  tablet by mouth daily.       No current facility-administered medications on file prior to visit.    BP 120/84 mmHg  Pulse 62  Temp(Src) 98.1 F (36.7 C)  Ht 5\' 5"  (1.651 m)  Wt 199 lb 6.4 oz (90.447 kg)  BMI 33.18 kg/m2  SpO2 96%    Objective:   Physical Exam  Constitutional: She appears well-nourished.  Cardiovascular: Normal rate and regular rhythm.   Pulmonary/Chest: Effort normal and breath sounds normal.  Skin: Skin is warm and dry.  Mild to moderate swelling to soft tissue over left lateral malleolus. Nontender. No ecchymosis noted. Good pedal pulses.          Assessment & Plan:  Ankle swelling:  Present since Monday this week with discomfort. Injury occurred in January 2017 but did not seek treatment. X-ray today without evidence of fracture or bony abnormality. Suspect swelling related to increase in activity recently and also obesity. We'll have her elevate her extremities in the evening, use ibuprofen as needed for swelling and discomfort, and wrap with Ace bandage for stability. If symptoms persist may need to consider MRI.

## 2015-07-11 ENCOUNTER — Telehealth: Payer: Self-pay

## 2015-07-11 NOTE — Telephone Encounter (Signed)
Patient called back and requested form to be faxed to her at [phone].

## 2015-07-11 NOTE — Telephone Encounter (Signed)
Pt left v/m; pt was seen on 07/08/15 and pt spoke with Allie Bossier NP CMA and was advised [employer] form would be filled out. Pt request cb about status of form being completed.

## 2015-07-11 NOTE — Telephone Encounter (Signed)
Message left for patient to return my call.  

## 2015-09-04 ENCOUNTER — Emergency Department (HOSPITAL_COMMUNITY)
Admission: EM | Admit: 2015-09-04 | Discharge: 2015-09-04 | Disposition: A | Discharge status: Home / Self Care | Payer: 59 | Attending: Emergency Medicine | Admitting: Emergency Medicine

## 2015-09-04 ENCOUNTER — Emergency Department (HOSPITAL_COMMUNITY): Payer: 59

## 2015-09-04 ENCOUNTER — Encounter (HOSPITAL_COMMUNITY): Payer: Self-pay | Admitting: *Deleted

## 2015-09-04 DIAGNOSIS — D72810 Lymphocytopenia: Secondary | ICD-10-CM

## 2015-09-04 DIAGNOSIS — Z87891 Personal history of nicotine dependence: Secondary | ICD-10-CM | POA: Diagnosis not present

## 2015-09-04 DIAGNOSIS — W57XXXA Bitten or stung by nonvenomous insect and other nonvenomous arthropods, initial encounter: Secondary | ICD-10-CM

## 2015-09-04 DIAGNOSIS — Y999 Unspecified external cause status: Secondary | ICD-10-CM | POA: Insufficient documentation

## 2015-09-04 DIAGNOSIS — E871 Hypo-osmolality and hyponatremia: Secondary | ICD-10-CM

## 2015-09-04 DIAGNOSIS — Y929 Unspecified place or not applicable: Secondary | ICD-10-CM | POA: Diagnosis not present

## 2015-09-04 DIAGNOSIS — Y939 Activity, unspecified: Secondary | ICD-10-CM | POA: Diagnosis not present

## 2015-09-04 DIAGNOSIS — S80862A Insect bite (nonvenomous), left lower leg, initial encounter: Secondary | ICD-10-CM | POA: Diagnosis present

## 2015-09-04 DIAGNOSIS — L03116 Cellulitis of left lower limb: Secondary | ICD-10-CM

## 2015-09-04 LAB — BASIC METABOLIC PANEL
Anion gap: 11 (ref 5–15)
BUN: 10 mg/dL (ref 6–20)
CALCIUM: 8.3 mg/dL — AB (ref 8.9–10.3)
CO2: 19 mmol/L — ABNORMAL LOW (ref 22–32)
Chloride: 103 mmol/L (ref 101–111)
Creatinine, Ser: 1.12 mg/dL — ABNORMAL HIGH (ref 0.44–1.00)
GFR calc Af Amer: 59 mL/min — ABNORMAL LOW (ref >60)
GFR, EST NON AFRICAN AMERICAN: 51 mL/min — AB (ref >60)
GLUCOSE: 144 mg/dL — AB (ref 65–99)
POTASSIUM: 3.6 mmol/L (ref 3.5–5.1)
SODIUM: 133 mmol/L — AB (ref 135–145)

## 2015-09-04 LAB — CBC WITH DIFFERENTIAL/PLATELET
BASOS ABS: 0.1 10*3/uL (ref 0.0–0.1)
Basophils Relative: 1 %
EOS ABS: 0.1 10*3/uL (ref 0.0–0.7)
EOS PCT: 1 %
HCT: 37.5 % (ref 36.0–46.0)
Hemoglobin: 11.8 g/dL — ABNORMAL LOW (ref 12.0–15.0)
LYMPHS PCT: 16 %
Lymphs Abs: 0.6 10*3/uL — ABNORMAL LOW (ref 0.7–4.0)
MCH: 27.4 pg (ref 26.0–34.0)
MCHC: 31.5 g/dL (ref 30.0–36.0)
MCV: 87.2 fL (ref 78.0–100.0)
MONO ABS: 0.5 10*3/uL (ref 0.1–1.0)
Monocytes Relative: 14 %
Neutro Abs: 2.4 10*3/uL (ref 1.7–7.7)
Neutrophils Relative %: 68 %
PLATELETS: 168 10*3/uL (ref 150–400)
RBC: 4.3 MIL/uL (ref 3.87–5.11)
RDW: 13.7 % (ref 11.5–15.5)
WBC: 3.6 10*3/uL — AB (ref 4.0–10.5)

## 2015-09-04 LAB — HEPATIC FUNCTION PANEL
ALBUMIN: 3.8 g/dL (ref 3.5–5.0)
ALT: 31 U/L (ref 14–54)
AST: 26 U/L (ref 15–41)
Alkaline Phosphatase: 89 U/L (ref 38–126)
BILIRUBIN INDIRECT: 0.2 mg/dL — AB (ref 0.3–0.9)
Bilirubin, Direct: 0.1 mg/dL (ref 0.1–0.5)
TOTAL PROTEIN: 6.7 g/dL (ref 6.5–8.1)
Total Bilirubin: 0.3 mg/dL (ref 0.3–1.2)

## 2015-09-04 LAB — LIPASE, BLOOD: LIPASE: 18 U/L (ref 11–51)

## 2015-09-04 LAB — LACTIC ACID, PLASMA
LACTIC ACID, VENOUS: 1.6 mmol/L (ref 0.5–1.9)
Lactic Acid, Venous: 1.1 mmol/L (ref 0.5–1.9)

## 2015-09-04 MED ORDER — DOXYCYCLINE HYCLATE 100 MG PO CAPS: 100.0000 mg | ORAL_CAPSULE | Freq: Two times a day (BID) | ORAL | 0 refills | Status: AC

## 2015-09-04 MED ORDER — DEXTROSE 5 % IV SOLN
1.0000 g | Freq: Once | INTRAVENOUS | Status: AC
  Administered 2015-09-04: 1 g via INTRAVENOUS
  Filled 2015-09-04: qty 10

## 2015-09-04 MED ORDER — CEPHALEXIN 500 MG PO CAPS: 1000.0000 mg | ORAL_CAPSULE | Freq: Two times a day (BID) | ORAL | 0 refills | Status: AC

## 2015-09-04 MED ORDER — SODIUM CHLORIDE 0.9 % IV SOLN
1500.0000 mg | Freq: Once | INTRAVENOUS | Status: AC
Start: 2015-09-04 — End: 2015-09-04
  Administered 2015-09-04: 1500 mg via INTRAVENOUS
  Filled 2015-09-04: qty 1500

## 2015-09-04 NOTE — ED Provider Notes (Signed)
[facility] DEPT Provider Note   CSN: [id number] Arrival date & time: 09/04/15  0909  First Provider Contact:  None       History   Chief Complaint Chief Complaint  Patient presents with  . Insect Bite  . Fatigue  . Fever    HPI [name] [name] is a 65 y.o. female.  HPI  Patient who denies PMH presents for fatigue and rash.  One week ago, she noticed some bumps on her left calf, after doing yard work.  She also pulled a non-engorged tick off her leg that day.  Over the course of 2-3 days, the bumps became more swollen, with worsening redness and tenderness, and then developed a head and drained.  She was seen at urgent care 5 days ago and put on bactrim.  Since then, she complains of exertional dyspnea, generalized fatigue, and chills.  She denies: headache, chest pain, fevers, n/v, abdominal pain, dysuria.  Past Medical History:  Diagnosis Date  . Colon polyps   . IBS (irritable bowel syndrome)    possible    Patient Active Problem List   Diagnosis Date Noted  . Obesity 01/27/2015  . Routine gynecological examination 03/11/2012  . Routine general medical examination at a health care facility 12/27/2010  . GERD (gastroesophageal reflux disease) 12/27/2010  . History of colonic polyps - adenomas 04/09/2008  . TOBACCO USE, QUIT 04/09/2008  . POSTMENOPAUSAL STATUS 04/09/2008    Past Surgical History:  Procedure Laterality Date  . COLONOSCOPY      OB History    No data available       Home Medications    Prior to Admission medications   Medication Sig Start Date End Date Taking? Authorizing Provider  Multiple Vitamin (MULTIVITAMIN) tablet Take 1 tablet by mouth daily.      Historical Provider, MD    Family History Family History  Problem Relation Age of Onset  . Colon cancer Mother 47  . Colon cancer Father 77  . Colon polyps Brother   . Colon polyps Brother   . Esophageal cancer Neg Hx   . Rectal cancer Neg Hx   . Stomach cancer Neg Hx      Social History Social History  Substance Use Topics  . Smoking status: Former Smoker    Quit date: 01/29/2001  . Smokeless tobacco: Never Used  . Alcohol use No     Allergies   Fish allergy   Review of Systems Review of Systems  Constitutional: Positive for chills. Negative for fever.  HENT: Negative for ear pain and sore throat.   Eyes: Negative for pain and visual disturbance.  Respiratory: Positive for shortness of breath. Negative for cough.   Cardiovascular: Negative for chest pain and palpitations.  Gastrointestinal: Negative for abdominal pain and vomiting.  Genitourinary: Negative for dysuria and hematuria.  Musculoskeletal: Negative for arthralgias and back pain.  Skin: Negative for color change and rash.  Neurological: Negative for seizures and syncope.  All other systems reviewed and are negative.    Physical Exam Updated Vital Signs BP 137/69 (BP Location: Left Arm)   Pulse 87   Temp 99.3 F (37.4 C) (Oral)   Resp 26   SpO2 97%   Physical Exam  Constitutional: She appears well-developed and well-nourished. No distress.  HENT:  Head: Normocephalic and atraumatic.  Eyes: Conjunctivae are normal.  Neck: Neck supple.  Cardiovascular: Normal rate and regular rhythm.   No murmur heard. Pulmonary/Chest: Effort normal and breath sounds normal. No respiratory distress.  Abdominal: Soft. There is no tenderness.  Musculoskeletal: She exhibits no edema.       Legs: Neurological: She is alert.  Skin: Skin is warm and dry.  Psychiatric: She has a normal mood and affect.  Nursing note and vitals reviewed.    ED Treatments / Results  Labs (all labs ordered are listed, but only abnormal results are displayed) Labs Reviewed  CULTURE, BLOOD (ROUTINE X 2)  CULTURE, BLOOD (ROUTINE X 2)  CBC WITH DIFFERENTIAL/PLATELET  BASIC METABOLIC PANEL  LACTIC ACID, PLASMA  LACTIC ACID, PLASMA  HEPATIC FUNCTION PANEL  LIPASE, BLOOD    EKG  EKG  Interpretation  Date/Time:  Sunday September 04 2015 09:13:52 EDT Ventricular Rate:  87 PR Interval:  172 QRS Duration: 76 QT Interval:  352 QTC Calculation: 423 R Axis:   -91 Text Interpretation:  Normal sinus rhythm Right superior axis deviation Low voltage QRS Inferior infarct , age undetermined Cannot rule out Anterior infarct , age undetermined Abnormal ECG agree.no old comparison. Confirmed by Johnney Killian, MD, Jeannie Done [phone]) on 09/04/2015 9:22:13 AM       Radiology No results found.  Procedures Procedures (including critical care time)  Medications Ordered in ED Medications - No data to display   Initial Impression / Assessment and Plan / ED Course  I have reviewed the triage vital signs and the nursing notes.  Pertinent labs & imaging results that were available during my care of the patient were reviewed by me and considered in my medical decision making (see chart for details).  Clinical Course    The exam is concerning for cellulitis, however no evidence of abscess is seen.  The patient appears non-toxic here with normal vital signs.  Labs, including cultures, drawn.  Empiric antibiotics given in the setting of persistent cellulitis.    Patient has a mild hyponatremia and mild lymphocytopenia.  Clinically she is of lower concern for RMSF or anaplasmosis (no rash, no fever), however with these lab abnormalities I will cover empirically with doxy.  Will also switch PO abx for coverage of cellulitis.  I have a low suspicion for pneumonia given there is no cough, fever, and CXR has no infiltrates or effusion.  There are no urinary symptoms to suggest UTI.  There are no abdominal symptoms, or abdominal tenderness, to suggest intraabdominal pathology, such as appendicitis or cholecystitis.  No AMS, meningismus, or neuro deficits to suggest meningitis or encephalitis.   I have asked her to f/u with PCP for recheck of labs in one week.  We have discussed the discharge plan, including  the plan for outpatient followup, and strict return precautions, including those that would require calling 911.     Final Clinical Impressions(s) / ED Diagnoses   Final diagnoses:  None    New Prescriptions New Prescriptions   No medications on file     Levada Schilling, MD 09/04/15 1218    Charlesetta Shanks, MD 09/04/15 [phone]

## 2015-09-04 NOTE — ED Triage Notes (Signed)
Pt reports two insect bites to left lower leg for several days, redness noted. Also had recent tick bite to right lower leg. Has fatigue, lack of appetite, sob, chills, headache. Has been to an ucc and started on bactrim wed.

## 2015-09-04 NOTE — Discharge Instructions (Signed)
Please stop taking the bactrim, and start the two antibiotic prescriptions we have provided.  You had a slightly low sodium here and slightly low white blood cell count.  Please see your doctor in a week to have the labs reassessed.

## 2015-09-06 LAB — AEROBIC CULTURE  (SUPERFICIAL SPECIMEN): CULTURE: NO GROWTH

## 2015-09-06 LAB — AEROBIC CULTURE W GRAM STAIN (SUPERFICIAL SPECIMEN)

## 2015-09-09 LAB — CULTURE, BLOOD (ROUTINE X 2)
CULTURE: NO GROWTH
Culture: NO GROWTH

## 2015-10-31 ENCOUNTER — Other Ambulatory Visit: Payer: Self-pay | Admitting: Family Medicine

## 2015-10-31 DIAGNOSIS — Z1231 Encounter for screening mammogram for malignant neoplasm of breast: Secondary | ICD-10-CM

## 2015-11-02 ENCOUNTER — Other Ambulatory Visit: Payer: Self-pay | Admitting: Family Medicine

## 2015-11-02 ENCOUNTER — Ambulatory Visit (HOSPITAL_BASED_OUTPATIENT_CLINIC_OR_DEPARTMENT_OTHER)
Admission: RE | Admit: 2015-11-02 | Discharge: 2015-11-02 | Disposition: A | Discharge status: Home / Self Care | Payer: 59 | Source: Ambulatory Visit | Attending: Family Medicine | Admitting: Family Medicine

## 2015-11-02 DIAGNOSIS — Z1231 Encounter for screening mammogram for malignant neoplasm of breast: Secondary | ICD-10-CM | POA: Diagnosis not present

## 2016-01-31 ENCOUNTER — Ambulatory Visit (INDEPENDENT_AMBULATORY_CARE_PROVIDER_SITE_OTHER): Payer: 59 | Admitting: Family Medicine

## 2016-01-31 ENCOUNTER — Encounter: Payer: Self-pay | Admitting: Family Medicine

## 2016-01-31 VITALS — BP 102/70 | HR 87 | Temp 99.4°F | Ht 64.5 in | Wt 203.0 lb

## 2016-01-31 DIAGNOSIS — J069 Acute upper respiratory infection, unspecified: Secondary | ICD-10-CM

## 2016-01-31 MED ORDER — BENZONATATE 200 MG PO CAPS: 200.0000 mg | ORAL_CAPSULE | Freq: Three times a day (TID) | ORAL | 1 refills | Status: DC | PRN

## 2016-01-31 NOTE — Progress Notes (Signed)
Subjective:    Patient ID: [name] [name], female    DOB: 1950-04-27, 66 y.o.   MRN: [id number]  HPI Here for uri symptoms with low grade fever   Started a few days ago  Last night chills - thinks she has a fever  Bad aches  Dry mouth - drank a lot of water     Temp: 99.4 F (37.4 C)    Congestion and cough = not able to get anything out and hurts to cough  Blowing nose- green d/d No facial pain   Ears- itchy  Throat- a little sore   Had a flu shot in oct   Taking mucinex otc - regular   Patient Active Problem List   Diagnosis Date Noted  . URI with cough and congestion 01/31/2016  . Obesity 01/27/2015  . Routine gynecological examination 03/11/2012  . Routine general medical examination at a health care facility 12/27/2010  . GERD (gastroesophageal reflux disease) 12/27/2010  . History of colonic polyps - adenomas 04/09/2008  . TOBACCO USE, QUIT 04/09/2008  . POSTMENOPAUSAL STATUS 04/09/2008   Past Medical History:  Diagnosis Date  . Colon polyps   . IBS (irritable bowel syndrome)    possible   Past Surgical History:  Procedure Laterality Date  . COLONOSCOPY     Social History  Substance Use Topics  . Smoking status: Former Smoker    Quit date: 01/29/2001  . Smokeless tobacco: Never Used  . Alcohol use No   Family History  Problem Relation Age of Onset  . Colon cancer Mother 13  . Colon cancer Father 73  . Colon polyps Brother   . Colon polyps Brother   . Esophageal cancer Neg Hx   . Rectal cancer Neg Hx   . Stomach cancer Neg Hx    Allergies  Allergen Reactions  . Fish Allergy Hives   Current Outpatient Prescriptions on File Prior to Visit  Medication Sig Dispense Refill  . Multiple Vitamin (MULTIVITAMIN) tablet Take 1 tablet by mouth daily.       No current facility-administered medications on file prior to visit.     Review of Systems  Constitutional: Positive for appetite change and fatigue. Negative for fever.  HENT: Positive for  congestion, postnasal drip, rhinorrhea, sinus pressure, sneezing and sore throat. Negative for ear pain.   Eyes: Negative for pain and discharge.  Respiratory: Positive for cough. Negative for shortness of breath, wheezing and stridor.   Cardiovascular: Negative for chest pain.  Gastrointestinal: Negative for diarrhea, nausea and vomiting.  Genitourinary: Negative for frequency, hematuria and urgency.  Musculoskeletal: Negative for arthralgias and myalgias.  Skin: Negative for rash.  Neurological: Positive for headaches. Negative for dizziness, weakness and light-headedness.  Psychiatric/Behavioral: Negative for confusion and dysphoric mood.       Objective:   Physical Exam  Constitutional: She appears well-developed and well-nourished. No distress.  overwt and fatigued appearing   HENT:  Head: Normocephalic and atraumatic.  Right Ear: External ear normal.  Left Ear: External ear normal.  Mouth/Throat: Oropharynx is clear and moist.  Nares are injected and congested  No sinus tenderness Clear rhinorrhea and post nasal drip   Eyes: Conjunctivae and EOM are normal. Pupils are equal, round, and reactive to light. Right eye exhibits no discharge. Left eye exhibits no discharge.  Neck: Normal range of motion. Neck supple.  Cardiovascular: Normal rate and normal heart sounds.   Pulmonary/Chest: Effort normal and breath sounds normal. No respiratory distress. She has no wheezes.  She has no rales. She exhibits no tenderness.  Harsh bs Good air exch No rales/rhonchi or wheeze   Lymphadenopathy:    She has no cervical adenopathy.  Neurological: She is alert.  Skin: Skin is warm and dry. No rash noted.  Psychiatric: She has a normal mood and affect.  In good spirits           Assessment & Plan:   Problem List Items Addressed This Visit      Respiratory   URI with cough and congestion    Disc symptomatic care - see instructions on AVS Will try mucinex with DM  Also tessalon  for cough  For fever/chills- acetaminophen and /or ibuprofen  Fluids/ rest  Update if not starting to improve in a week or if worsening   Off work 2 d

## 2016-01-31 NOTE — Progress Notes (Signed)
Pre visit review using our clinic review tool, if applicable. No additional management support is needed unless otherwise documented below in the visit note. 

## 2016-01-31 NOTE — Assessment & Plan Note (Signed)
Disc symptomatic care - see instructions on AVS Will try mucinex with DM  Also tessalon for cough  For fever/chills- acetaminophen and /or ibuprofen  Fluids/ rest  Update if not starting to improve in a week or if worsening   Off work 2 d

## 2016-01-31 NOTE — Patient Instructions (Addendum)
Stay off work and rest for 2 days  Drink lots of fluids - 64 oz per day- mostly water  Acetaminophen for fever/aches and chills Ibuprofen also helps the same  mucinex DM is helpful for cough and congestion  Tessalon- also for cough - you can take with mucinex   Update if not starting to improve in a week or if worsening    Upper Respiratory Infection, Adult Most upper respiratory infections (URIs) are a viral infection of the air passages leading to the lungs. A URI affects the nose, throat, and upper air passages. The most common type of URI is nasopharyngitis and is typically referred to as "the common cold." URIs run their course and usually go away on their own. Most of the time, a URI does not require medical attention, but sometimes a bacterial infection in the upper airways can follow a viral infection. This is called a secondary infection. Sinus and middle ear infections are common types of secondary upper respiratory infections. Bacterial pneumonia can also complicate a URI. A URI can worsen asthma and chronic obstructive pulmonary disease (COPD). Sometimes, these complications can require emergency medical care and may be life threatening. What are the causes? Almost all URIs are caused by viruses. A virus is a type of germ and can spread from one person to another. What increases the risk? You may be at risk for a URI if:  You smoke.  You have chronic heart or lung disease.  You have a weakened defense (immune) system.  You are very young or very old.  You have nasal allergies or asthma.  You work in crowded or poorly ventilated areas.  You work in health care facilities or schools. What are the signs or symptoms? Symptoms typically develop 2-3 days after you come in contact with a cold virus. Most viral URIs last 7-10 days. However, viral URIs from the influenza virus (flu virus) can last 14-18 days and are typically more severe. Symptoms may include:  Runny or stuffy  (congested) nose.  Sneezing.  Cough.  Sore throat.  Headache.  Fatigue.  Fever.  Loss of appetite.  Pain in your forehead, behind your eyes, and over your cheekbones (sinus pain).  Muscle aches. How is this diagnosed? Your health care provider may diagnose a URI by:  Physical exam.  Tests to check that your symptoms are not due to another condition such as:  Strep throat.  Sinusitis.  Pneumonia.  Asthma. How is this treated? A URI goes away on its own with time. It cannot be cured with medicines, but medicines may be prescribed or recommended to relieve symptoms. Medicines may help:  Reduce your fever.  Reduce your cough.  Relieve nasal congestion. Follow these instructions at home:  Take medicines only as directed by your health care provider.  Gargle warm saltwater or take cough drops to comfort your throat as directed by your health care provider.  Use a warm mist humidifier or inhale steam from a shower to increase air moisture. This may make it easier to breathe.  Drink enough fluid to keep your urine clear or pale yellow.  Eat soups and other clear broths and maintain good nutrition.  Rest as needed.  Return to work when your temperature has returned to normal or as your health care provider advises. You may need to stay home longer to avoid infecting others. You can also use a face mask and careful hand washing to prevent spread of the virus.  Increase the usage  of your inhaler if you have asthma.  Do not use any tobacco products, including cigarettes, chewing tobacco, or electronic cigarettes. If you need help quitting, ask your health care provider. How is this prevented? The best way to protect yourself from getting a cold is to practice good hygiene.  Avoid oral or hand contact with people with cold symptoms.  Wash your hands often if contact occurs. There is no clear evidence that vitamin C, vitamin E, echinacea, or exercise reduces the  chance of developing a cold. However, it is always recommended to get plenty of rest, exercise, and practice good nutrition. Contact a health care provider if:  You are getting worse rather than better.  Your symptoms are not controlled by medicine.  You have chills.  You have worsening shortness of breath.  You have brown or red mucus.  You have yellow or brown nasal discharge.  You have pain in your face, especially when you bend forward.  You have a fever.  You have swollen neck glands.  You have pain while swallowing.  You have white areas in the back of your throat. Get help right away if:  You have severe or persistent:  Headache.  Ear pain.  Sinus pain.  Chest pain.  You have chronic lung disease and any of the following:  Wheezing.  Prolonged cough.  Coughing up blood.  A change in your usual mucus.  You have a stiff neck.  You have changes in your:  Vision.  Hearing.  Thinking.  Mood. This information is not intended to replace advice given to you by your health care provider. Make sure you discuss any questions you have with your health care provider. Document Released: 07/11/2000 Document Revised: 09/18/2015 Document Reviewed: 04/22/2013 Elsevier Interactive Patient Education  2017 [employer].

## 2016-09-27 ENCOUNTER — Telehealth: Payer: Self-pay | Admitting: Family Medicine

## 2016-09-27 DIAGNOSIS — Z0000 Encounter for general adult medical examination without abnormal findings: Secondary | ICD-10-CM

## 2016-09-27 NOTE — Telephone Encounter (Signed)
-----   Message from Ellamae Sia sent at 09/27/2016  3:44 PM EDT ----- Regarding: Lab orders for Friday, 9.7.18 Patient is scheduled for CPX labs, please order future labs, Thanks , Karna Christmas

## 2016-10-05 ENCOUNTER — Other Ambulatory Visit (INDEPENDENT_AMBULATORY_CARE_PROVIDER_SITE_OTHER): Payer: 59

## 2016-10-05 DIAGNOSIS — Z0000 Encounter for general adult medical examination without abnormal findings: Secondary | ICD-10-CM

## 2016-10-05 LAB — CBC WITH DIFFERENTIAL/PLATELET
BASOS PCT: 1.1 % (ref 0.0–3.0)
Basophils Absolute: 0.1 10*3/uL (ref 0.0–0.1)
EOS ABS: 0.2 10*3/uL (ref 0.0–0.7)
Eosinophils Relative: 3.5 % (ref 0.0–5.0)
HCT: 38 % (ref 36.0–46.0)
HEMOGLOBIN: 12.3 g/dL (ref 12.0–15.0)
Lymphocytes Relative: 22.2 % (ref 12.0–46.0)
Lymphs Abs: 1.5 10*3/uL (ref 0.7–4.0)
MCHC: 32.4 g/dL (ref 30.0–36.0)
MCV: 88.1 fl (ref 78.0–100.0)
MONOS PCT: 7.8 % (ref 3.0–12.0)
Monocytes Absolute: 0.5 10*3/uL (ref 0.1–1.0)
NEUTROS ABS: 4.4 10*3/uL (ref 1.4–7.7)
Neutrophils Relative %: 65.4 % (ref 43.0–77.0)
PLATELETS: 207 10*3/uL (ref 150.0–400.0)
RBC: 4.31 Mil/uL (ref 3.87–5.11)
RDW: 13.7 % (ref 11.5–15.5)
WBC: 6.7 10*3/uL (ref 4.0–10.5)

## 2016-10-05 LAB — COMPREHENSIVE METABOLIC PANEL
ALBUMIN: 4.3 g/dL (ref 3.5–5.2)
ALT: 13 U/L (ref 0–35)
AST: 17 U/L (ref 0–37)
Alkaline Phosphatase: 92 U/L (ref 39–117)
BILIRUBIN TOTAL: 0.4 mg/dL (ref 0.2–1.2)
BUN: 19 mg/dL (ref 6–23)
CO2: 29 meq/L (ref 19–32)
CREATININE: 1.06 mg/dL (ref 0.40–1.20)
Calcium: 10 mg/dL (ref 8.4–10.5)
Chloride: 104 mEq/L (ref 96–112)
GFR: 55.34 mL/min — AB (ref >60.00)
Glucose, Bld: 90 mg/dL (ref 70–99)
Potassium: 3.8 mEq/L (ref 3.5–5.1)
Sodium: 143 mEq/L (ref 135–145)
Total Protein: 7.4 g/dL (ref 6.0–8.3)

## 2016-10-05 LAB — LIPID PANEL
CHOLESTEROL: 186 mg/dL (ref 0–200)
HDL: 57.2 mg/dL (ref >39.00)
LDL Cholesterol: 114 mg/dL — ABNORMAL HIGH (ref 0–99)
NonHDL: 128.45
Total CHOL/HDL Ratio: 3
Triglycerides: 71 mg/dL (ref 0.0–149.0)
VLDL: 14.2 mg/dL (ref 0.0–40.0)

## 2016-10-05 LAB — TSH: TSH: 0.82 u[IU]/mL (ref 0.35–4.50)

## 2016-10-09 ENCOUNTER — Other Ambulatory Visit (HOSPITAL_COMMUNITY)
Admission: RE | Admit: 2016-10-09 | Discharge: 2016-10-09 | Disposition: A | Discharge status: Home / Self Care | Payer: 59 | Source: Ambulatory Visit | Attending: Family Medicine | Admitting: Family Medicine

## 2016-10-09 ENCOUNTER — Encounter: Payer: Self-pay | Admitting: Family Medicine

## 2016-10-09 ENCOUNTER — Ambulatory Visit (INDEPENDENT_AMBULATORY_CARE_PROVIDER_SITE_OTHER): Payer: 59 | Admitting: Family Medicine

## 2016-10-09 VITALS — BP 102/66 | HR 60 | Temp 98.1°F | Ht 64.5 in | Wt 172.0 lb

## 2016-10-09 DIAGNOSIS — Z01419 Encounter for gynecological examination (general) (routine) without abnormal findings: Secondary | ICD-10-CM

## 2016-10-09 DIAGNOSIS — E663 Overweight: Secondary | ICD-10-CM

## 2016-10-09 DIAGNOSIS — Z23 Encounter for immunization: Secondary | ICD-10-CM

## 2016-10-09 DIAGNOSIS — Z0000 Encounter for general adult medical examination without abnormal findings: Secondary | ICD-10-CM

## 2016-10-09 NOTE — Assessment & Plan Note (Signed)
Commended wt loss with wt watchers! Enc exercise   Discussed how this problem influences overall health and the risks it imposes  Reviewed plan for weight loss with lower calorie diet (via better food choices and also portion control or program like weight watchers) and exercise building up to or more than 30 minutes 5 days per week including some aerobic activity

## 2016-10-09 NOTE — Patient Instructions (Addendum)
Your mammogram is due in October - get it as planned  Also get your flu shot as planned   Tetanus shot today  (Tdap)  Keep up the good work with diet and exercise

## 2016-10-09 NOTE — Assessment & Plan Note (Signed)
Pap and gyn exam  No c/o or abnormalities noted

## 2016-10-09 NOTE — Progress Notes (Signed)
Subjective:    Patient ID: [name] [name], female    DOB: 1950-08-25, 66 y.o.   MRN: [id number]  HPI Here for health maintenance exam and to review chronic medical problems     Wt Readings from Last 3 Encounters:  10/09/16 172 lb (78 kg)  01/31/16 203 lb (92.1 kg)  07/08/15 199 lb 6.4 oz (90.4 kg)  doing weight watchers at work - very happy!  Some exercise /walking  29.07 kg/m   Has not had heartburn once!  Pap 2/14 Does not see a gyn  No gyn symptoms or new partners    Flu shot -will get at work for free   Mammogram 10/17-normal - will get it as scheduled with a work program  Self breast exam -no lumps   Colonoscopy 6/16 Recall is 6/21 (strong family hx of colon cancer- parents / brother has polyps)  Td 10/07- due for one   zostavax 2/14  BP Readings from Last 3 Encounters:  10/09/16 102/66  01/31/16 102/70  09/04/15 108/71   Pulse Readings from Last 3 Encounters:  10/09/16 60  01/31/16 87  09/04/15 72    Labs Results for orders placed or performed in visit on 10/05/16  CBC with Differential/Platelet  Result Value Ref Range   WBC 6.7 4.0 - 10.5 K/uL   RBC 4.31 3.87 - 5.11 Mil/uL   Hemoglobin 12.3 12.0 - 15.0 g/dL   HCT 38.0 36.0 - 46.0 %   MCV 88.1 78.0 - 100.0 fl   MCHC 32.4 30.0 - 36.0 g/dL   RDW 13.7 11.5 - 15.5 %   Platelets 207.0 150.0 - 400.0 K/uL   Neutrophils Relative % 65.4 43.0 - 77.0 %   Lymphocytes Relative 22.2 12.0 - 46.0 %   Monocytes Relative 7.8 3.0 - 12.0 %   Eosinophils Relative 3.5 0.0 - 5.0 %   Basophils Relative 1.1 0.0 - 3.0 %   Neutro Abs 4.4 1.4 - 7.7 K/uL   Lymphs Abs 1.5 0.7 - 4.0 K/uL   Monocytes Absolute 0.5 0.1 - 1.0 K/uL   Eosinophils Absolute 0.2 0.0 - 0.7 K/uL   Basophils Absolute 0.1 0.0 - 0.1 K/uL  Comprehensive metabolic panel  Result Value Ref Range   Sodium 143 135 - 145 mEq/L   Potassium 3.8 3.5 - 5.1 mEq/L   Chloride 104 96 - 112 mEq/L   CO2 29 19 - 32 mEq/L   Glucose, Bld 90 70 - 99 mg/dL   BUN  19 6 - 23 mg/dL   Creatinine, Ser 1.06 0.40 - 1.20 mg/dL   Total Bilirubin 0.4 0.2 - 1.2 mg/dL   Alkaline Phosphatase 92 39 - 117 U/L   AST 17 0 - 37 U/L   ALT 13 0 - 35 U/L   Total Protein 7.4 6.0 - 8.3 g/dL   Albumin 4.3 3.5 - 5.2 g/dL   Calcium 10.0 8.4 - 10.5 mg/dL   GFR 55.34 (L) >60.00 mL/min  Lipid panel  Result Value Ref Range   Cholesterol 186 0 - 200 mg/dL   Triglycerides 71.0 0.0 - 149.0 mg/dL   HDL 57.20 >39.00 mg/dL   VLDL 14.2 0.0 - 40.0 mg/dL   LDL Cholesterol 114 (H) 0 - 99 mg/dL   Total CHOL/HDL Ratio 3    NonHDL 128.45   TSH  Result Value Ref Range   TSH 0.82 0.35 - 4.50 uIU/mL      Patient Active Problem List   Diagnosis Date Noted  . Overweight 01/27/2015  .  Encounter for routine gynecological examination 03/11/2012  . Routine general medical examination at a health care facility 12/27/2010  . GERD (gastroesophageal reflux disease) 12/27/2010  . History of colonic polyps - adenomas 04/09/2008  . TOBACCO USE, QUIT 04/09/2008  . POSTMENOPAUSAL STATUS 04/09/2008   Past Medical History:  Diagnosis Date  . Colon polyps   . IBS (irritable bowel syndrome)    possible   Past Surgical History:  Procedure Laterality Date  . COLONOSCOPY     Social History  Substance Use Topics  . Smoking status: Former Smoker    Quit date: 01/29/2001  . Smokeless tobacco: Never Used  . Alcohol use No   Family History  Problem Relation Age of Onset  . Colon cancer Mother 55  . Colon cancer Father 11  . Colon polyps Brother   . Colon polyps Brother   . Esophageal cancer Neg Hx   . Rectal cancer Neg Hx   . Stomach cancer Neg Hx    Allergies  Allergen Reactions  . Fish Allergy Hives   Current Outpatient Prescriptions on File Prior to Visit  Medication Sig Dispense Refill  . Multiple Vitamin (MULTIVITAMIN) tablet Take 1 tablet by mouth daily.       No current facility-administered medications on file prior to visit.      Review of Systems  Constitutional:  Negative for activity change, appetite change, fatigue, fever and unexpected weight change.  HENT: Negative for congestion, ear pain, rhinorrhea, sinus pressure and sore throat.   Eyes: Negative for pain, redness and visual disturbance.  Respiratory: Negative for cough, shortness of breath and wheezing.   Cardiovascular: Negative for chest pain and palpitations.  Gastrointestinal: Negative for abdominal pain, blood in stool, constipation and diarrhea.  Endocrine: Negative for polydipsia and polyuria.  Genitourinary: Negative for dysuria, frequency and urgency.  Musculoskeletal: Negative for arthralgias, back pain and myalgias.  Skin: Negative for pallor and rash.  Allergic/Immunologic: Negative for environmental allergies.  Neurological: Negative for dizziness, syncope and headaches.  Hematological: Negative for adenopathy. Does not bruise/bleed easily.  Psychiatric/Behavioral: Negative for decreased concentration and dysphoric mood. The patient is not nervous/anxious.        Objective:   Physical Exam  Constitutional: She appears well-developed and well-nourished. No distress.  obese and well appearing   HENT:  Head: Normocephalic and atraumatic.  Right Ear: External ear normal.  Left Ear: External ear normal.  Mouth/Throat: Oropharynx is clear and moist.  Eyes: Pupils are equal, round, and reactive to light. Conjunctivae and EOM are normal. No scleral icterus.  Neck: Normal range of motion. Neck supple. No JVD present. Carotid bruit is not present. No thyromegaly present.  Cardiovascular: Normal rate, regular rhythm, normal heart sounds and intact distal pulses.  Exam reveals no gallop.   Pulmonary/Chest: Effort normal and breath sounds normal. No respiratory distress. She has no wheezes. She exhibits no tenderness.  Abdominal: Soft. Bowel sounds are normal. She exhibits no distension, no abdominal bruit and no mass. There is no tenderness.  Genitourinary: No breast swelling,  tenderness, discharge or bleeding.  Genitourinary Comments: Breast exam: No mass, nodules, thickening, tenderness, bulging, retraction, inflamation, nipple discharge or skin changes noted.  No axillary or clavicular LA.             Anus appears normal w/o hemorrhoids or masses     External genitalia : nl appearance and hair distribution/no lesions     Urethral meatus : nl size, no lesions or prolapse     Urethra:  no masses, tenderness or scarring    Bladder : no masses or tenderness     Vagina: nl general appearance, no discharge or  Lesions, no significant cystocele  or rectocele     Cervix: no lesions/ discharge or friability    Uterus: nl size, contour, position, and mobility (not fixed) , non tender    Adnexa : no masses, tenderness, enlargement or nodularity        Musculoskeletal: Normal range of motion. She exhibits no edema or tenderness.  No kyphosis   Lymphadenopathy:    She has no cervical adenopathy.  Neurological: She is alert. She has normal reflexes. No cranial nerve deficit. She exhibits normal muscle tone. Coordination normal.  Skin: Skin is warm and dry. No rash noted. No erythema. No pallor.  Solar lentigines diffusely   Psychiatric: She has a normal mood and affect.          Assessment & Plan:   Problem List Items Addressed This Visit      Other   Encounter for routine gynecological examination    Pap and gyn exam  No c/o or abnormalities noted       Relevant Orders   Cytology - PAP   Overweight    Commended wt loss with wt watchers! Enc exercise   Discussed how this problem influences overall health and the risks it imposes  Reviewed plan for weight loss with lower calorie diet (via better food choices and also portion control or program like weight watchers) and exercise building up to or more than 30 minutes 5 days per week including some aerobic activity         Routine general medical examination at a health care  facility - Primary    Reviewed health habits including diet and exercise and skin cancer prevention Reviewed appropriate screening tests for age  Also reviewed health mt list, fam hx and immunization status , as well as social and family history   See HPI Labs rev  Tdap today  She will get a flu shot at work  Gyn exam and pap done  Colonoscopy recall is 2021 Commended wt loss        Other Visit Diagnoses    Need for Tdap vaccination       Relevant Orders   Tdap vaccine greater than or equal to 7yo IM (Completed)

## 2016-10-09 NOTE — Assessment & Plan Note (Addendum)
Reviewed health habits including diet and exercise and skin cancer prevention Reviewed appropriate screening tests for age  Also reviewed health mt list, fam hx and immunization status , as well as social and family history   See HPI Labs rev  Tdap today  She will get a flu shot at work  Gyn exam and pap done  Colonoscopy recall is 2021 Commended wt loss

## 2016-10-11 LAB — CYTOLOGY - PAP
Diagnosis: NEGATIVE
HPV: NOT DETECTED

## 2016-10-23 ENCOUNTER — Other Ambulatory Visit: Payer: Self-pay | Admitting: Family Medicine

## 2016-10-23 DIAGNOSIS — Z1231 Encounter for screening mammogram for malignant neoplasm of breast: Secondary | ICD-10-CM

## 2016-10-30 ENCOUNTER — Ambulatory Visit (HOSPITAL_BASED_OUTPATIENT_CLINIC_OR_DEPARTMENT_OTHER)
Admission: RE | Admit: 2016-10-30 | Discharge: 2016-10-30 | Disposition: A | Discharge status: Home / Self Care | Payer: 59 | Source: Ambulatory Visit | Attending: Family Medicine | Admitting: Family Medicine

## 2016-10-30 DIAGNOSIS — Z1231 Encounter for screening mammogram for malignant neoplasm of breast: Secondary | ICD-10-CM | POA: Diagnosis not present

## 2018-02-07 IMAGING — DX DG CHEST 2V
2 series · 2 of 2 positions shown · non-contrast
Comparison: No priors.

CLINICAL DATA: 64-year-old female with fatigue, shortness of
breath, chills, headaches and lack of appetite after starting
Bactrim therapy.

EXAM:
CHEST  2 VIEW

[w chest pa]
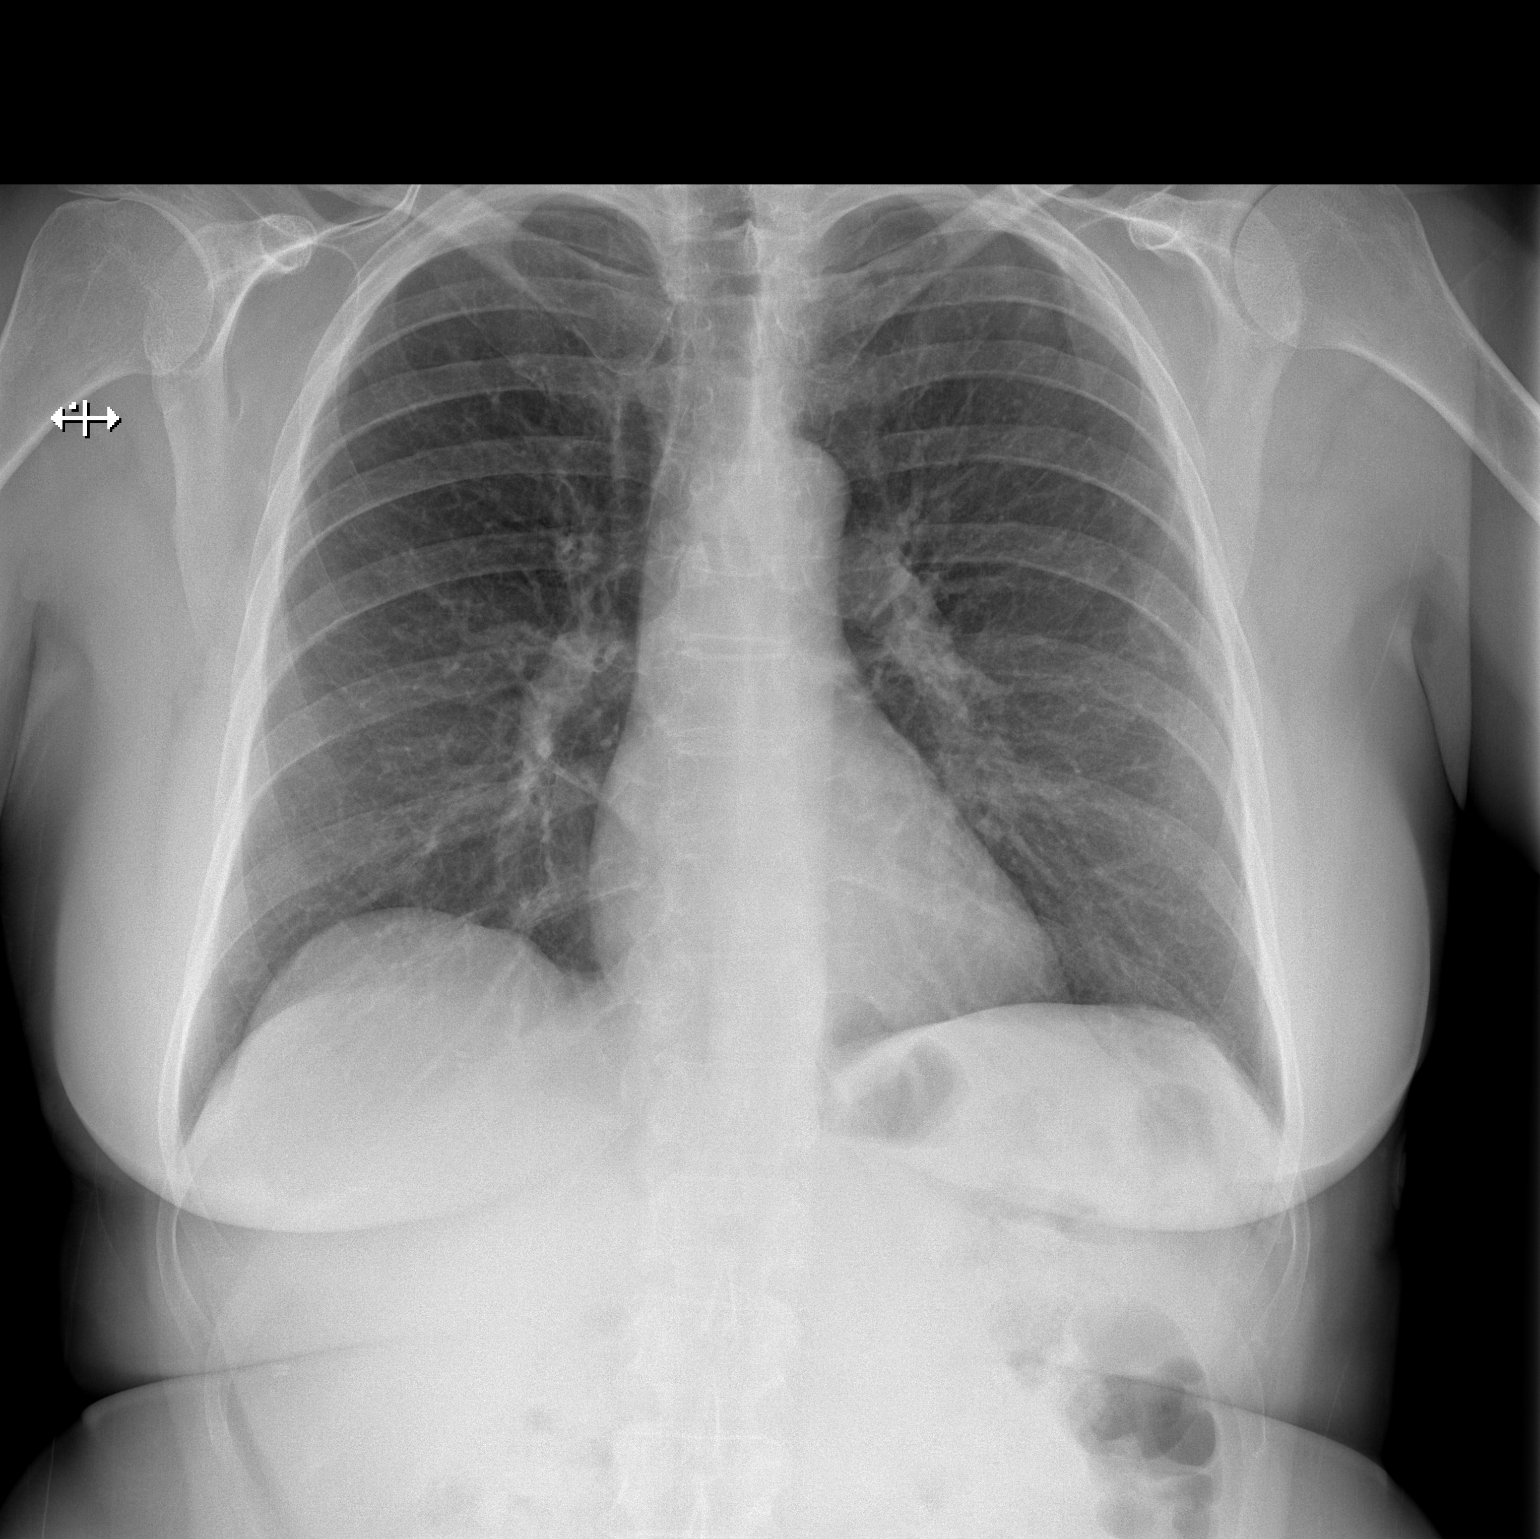

[w chest lat]
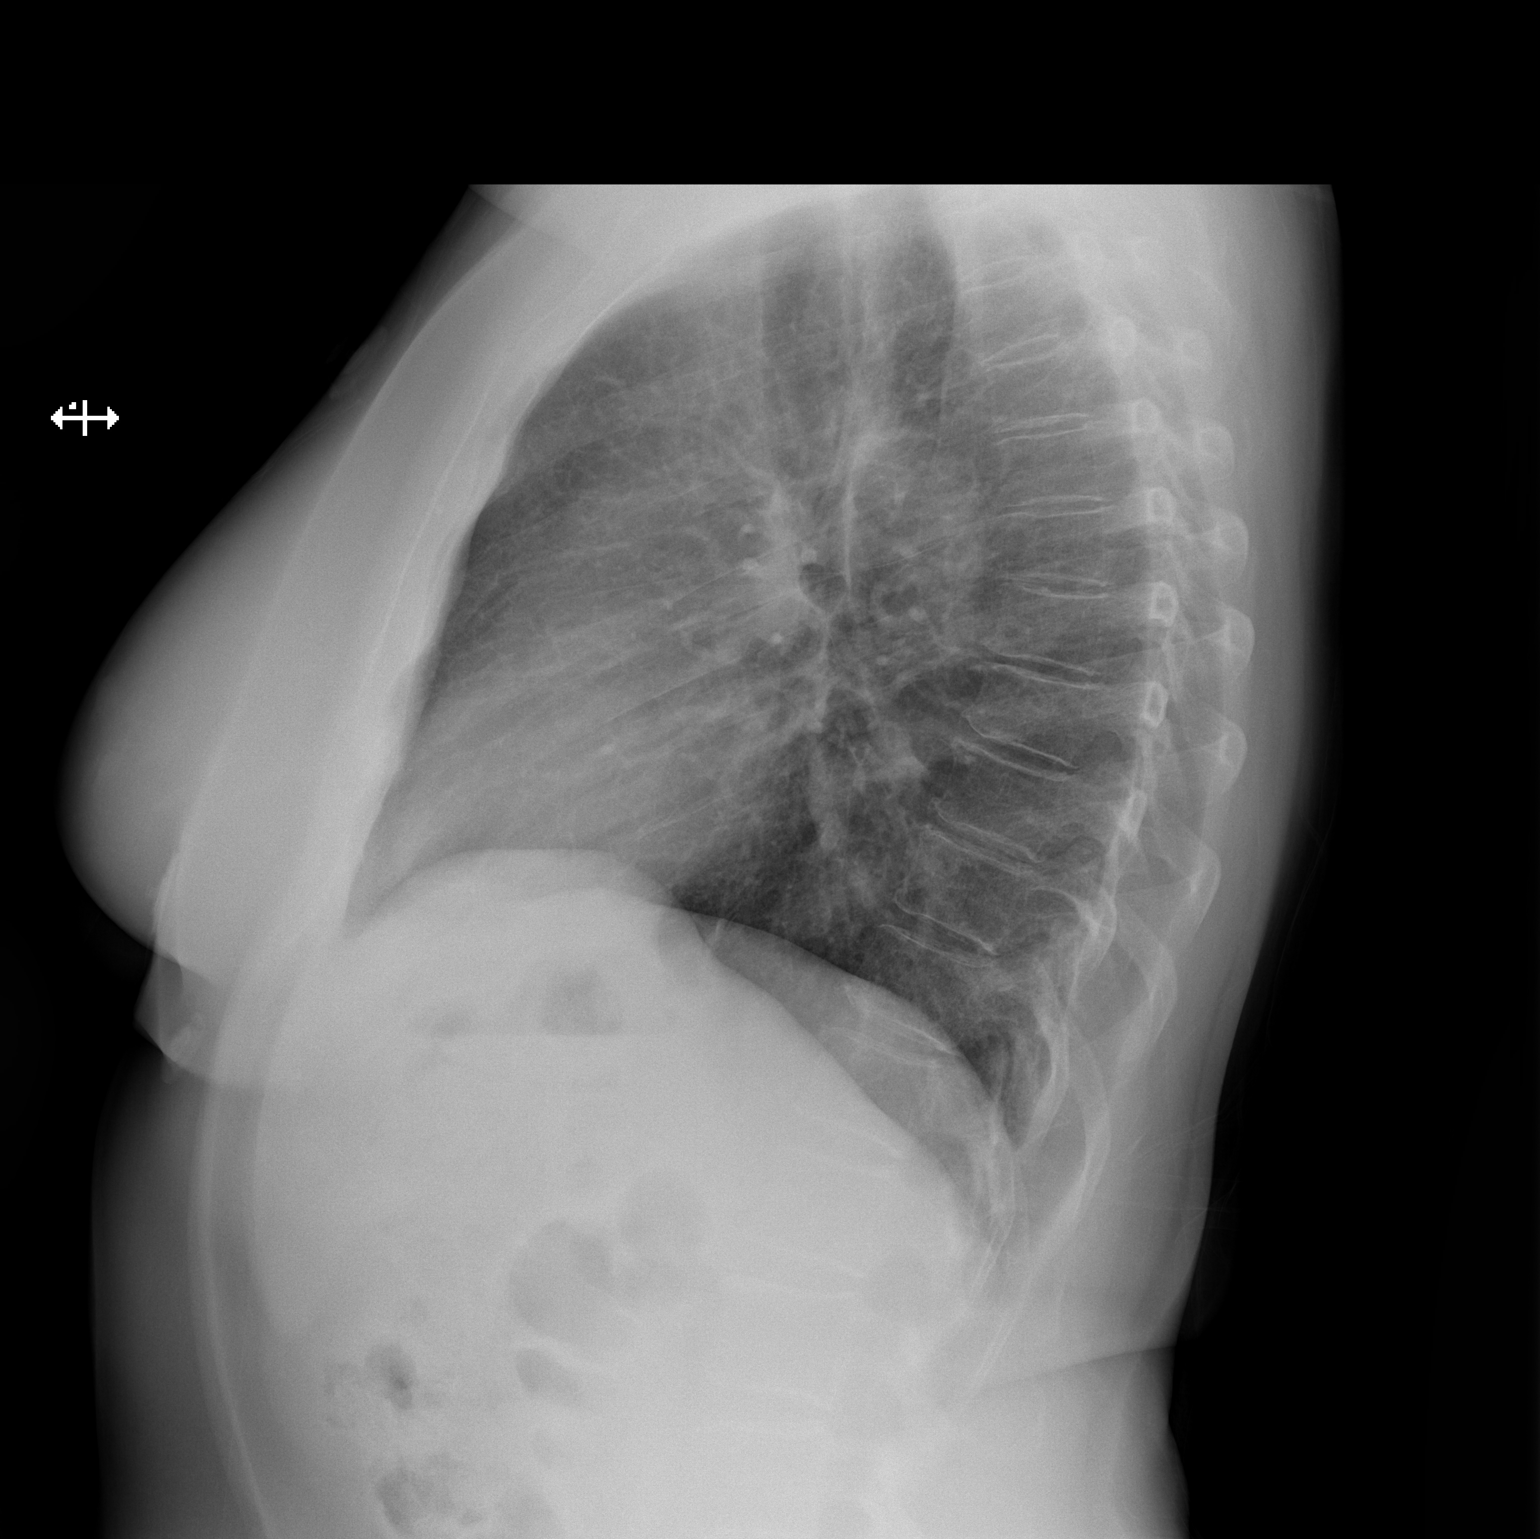

[2 of 2 positions shown; findings below may reference images not displayed]

FINDINGS: Lung volumes are normal. No consolidative airspace disease. No
pleural effusions. No pneumothorax. No pulmonary nodule or mass
noted. Pulmonary vasculature and the cardiomediastinal silhouette
are within normal limits.
IMPRESSION: No radiographic evidence of acute cardiopulmonary disease.
